# Patient Record
Sex: Male | Born: 1935 | Race: Black or African American | Hispanic: No | Smoking: Current some day smoker
Health system: Southern US, Community
[De-identification: ages and names within clinical notes are randomized; demographics above are authoritative.]

## PROBLEM LIST (undated history)

## (undated) DIAGNOSIS — I10 Essential (primary) hypertension: Secondary | ICD-10-CM

## (undated) DIAGNOSIS — I951 Orthostatic hypotension: Secondary | ICD-10-CM

## (undated) DIAGNOSIS — I739 Peripheral vascular disease, unspecified: Secondary | ICD-10-CM

## (undated) DIAGNOSIS — I472 Ventricular tachycardia: Secondary | ICD-10-CM

## (undated) DIAGNOSIS — E785 Hyperlipidemia, unspecified: Secondary | ICD-10-CM

## (undated) DIAGNOSIS — I252 Old myocardial infarction: Secondary | ICD-10-CM

## (undated) DIAGNOSIS — I4891 Unspecified atrial fibrillation: Secondary | ICD-10-CM

## (undated) DIAGNOSIS — I2510 Atherosclerotic heart disease of native coronary artery without angina pectoris: Secondary | ICD-10-CM

## (undated) DIAGNOSIS — I340 Nonrheumatic mitral (valve) insufficiency: Secondary | ICD-10-CM

## (undated) DIAGNOSIS — I4729 Other ventricular tachycardia: Secondary | ICD-10-CM

## (undated) DIAGNOSIS — R55 Syncope and collapse: Secondary | ICD-10-CM

## (undated) DIAGNOSIS — D649 Anemia, unspecified: Secondary | ICD-10-CM

## (undated) DIAGNOSIS — J449 Chronic obstructive pulmonary disease, unspecified: Secondary | ICD-10-CM

## (undated) DIAGNOSIS — I480 Paroxysmal atrial fibrillation: Secondary | ICD-10-CM

## (undated) DIAGNOSIS — E43 Unspecified severe protein-calorie malnutrition: Secondary | ICD-10-CM

## (undated) HISTORY — DX: Nonrheumatic mitral (valve) insufficiency: I34.0

## (undated) HISTORY — PX: CORONARY ANGIOPLASTY WITH STENT PLACEMENT: SHX49

---

## 2001-03-07 ENCOUNTER — Ambulatory Visit (HOSPITAL_COMMUNITY): Admission: RE | Admit: 2001-03-07 | Discharge: 2001-03-07 | Payer: Self-pay | Admitting: Nephrology

## 2001-09-15 ENCOUNTER — Emergency Department (HOSPITAL_COMMUNITY): Admission: EM | Admit: 2001-09-15 | Discharge: 2001-09-15 | Payer: Self-pay | Admitting: Emergency Medicine

## 2001-09-15 ENCOUNTER — Encounter: Payer: Self-pay | Admitting: Emergency Medicine

## 2002-02-21 ENCOUNTER — Ambulatory Visit (HOSPITAL_COMMUNITY): Admission: RE | Admit: 2002-02-21 | Discharge: 2002-02-22 | Payer: Self-pay | Admitting: Cardiology

## 2002-02-21 ENCOUNTER — Encounter: Payer: Self-pay | Admitting: Cardiology

## 2002-04-03 ENCOUNTER — Encounter (HOSPITAL_COMMUNITY): Admission: RE | Admit: 2002-04-03 | Discharge: 2002-05-03 | Payer: Self-pay | Admitting: *Deleted

## 2002-05-07 ENCOUNTER — Encounter (HOSPITAL_COMMUNITY): Admission: RE | Admit: 2002-05-07 | Discharge: 2002-06-06 | Payer: Self-pay | Admitting: *Deleted

## 2002-06-11 ENCOUNTER — Encounter (HOSPITAL_COMMUNITY): Admission: RE | Admit: 2002-06-11 | Discharge: 2002-07-11 | Payer: Self-pay | Admitting: *Deleted

## 2002-07-09 ENCOUNTER — Encounter (HOSPITAL_COMMUNITY): Admission: RE | Admit: 2002-07-09 | Discharge: 2002-08-08 | Payer: Self-pay | Admitting: *Deleted

## 2005-02-05 ENCOUNTER — Ambulatory Visit: Payer: Self-pay | Admitting: Family Medicine

## 2005-03-08 ENCOUNTER — Ambulatory Visit: Payer: Self-pay | Admitting: Family Medicine

## 2005-03-23 ENCOUNTER — Encounter (INDEPENDENT_AMBULATORY_CARE_PROVIDER_SITE_OTHER): Payer: Self-pay | Admitting: Family Medicine

## 2005-03-23 LAB — CONVERTED CEMR LAB: PSA: 1.59 ng/mL

## 2005-05-10 ENCOUNTER — Ambulatory Visit: Payer: Self-pay | Admitting: Family Medicine

## 2005-09-22 ENCOUNTER — Ambulatory Visit: Payer: Self-pay | Admitting: Family Medicine

## 2005-10-27 ENCOUNTER — Ambulatory Visit (HOSPITAL_COMMUNITY): Admission: RE | Admit: 2005-10-27 | Discharge: 2005-10-27 | Payer: Self-pay | Admitting: Family Medicine

## 2005-11-01 ENCOUNTER — Ambulatory Visit: Payer: Self-pay | Admitting: Family Medicine

## 2005-11-04 ENCOUNTER — Ambulatory Visit: Payer: Self-pay | Admitting: Internal Medicine

## 2005-11-12 ENCOUNTER — Ambulatory Visit (HOSPITAL_COMMUNITY): Admission: RE | Admit: 2005-11-12 | Discharge: 2005-11-12 | Payer: Self-pay | Admitting: *Deleted

## 2005-11-23 ENCOUNTER — Ambulatory Visit: Payer: Self-pay | Admitting: Internal Medicine

## 2005-12-09 ENCOUNTER — Ambulatory Visit: Payer: Self-pay | Admitting: Family Medicine

## 2005-12-17 ENCOUNTER — Encounter (INDEPENDENT_AMBULATORY_CARE_PROVIDER_SITE_OTHER): Payer: Self-pay | Admitting: Family Medicine

## 2005-12-27 ENCOUNTER — Encounter (INDEPENDENT_AMBULATORY_CARE_PROVIDER_SITE_OTHER): Payer: Self-pay | Admitting: Family Medicine

## 2005-12-27 LAB — CONVERTED CEMR LAB: aPTT: 30 s

## 2005-12-30 ENCOUNTER — Ambulatory Visit: Payer: Self-pay | Admitting: Family Medicine

## 2006-01-27 ENCOUNTER — Ambulatory Visit: Payer: Self-pay | Admitting: Family Medicine

## 2006-03-07 ENCOUNTER — Ambulatory Visit: Payer: Self-pay | Admitting: Family Medicine

## 2006-03-10 ENCOUNTER — Ambulatory Visit: Payer: Self-pay | Admitting: Internal Medicine

## 2006-03-28 ENCOUNTER — Ambulatory Visit: Payer: Self-pay | Admitting: Family Medicine

## 2006-03-28 LAB — CONVERTED CEMR LAB
PSA: 1.25 ng/mL
PSA: 1.25 ng/mL

## 2006-04-11 ENCOUNTER — Ambulatory Visit: Payer: Self-pay | Admitting: Family Medicine

## 2006-05-09 ENCOUNTER — Ambulatory Visit: Payer: Self-pay | Admitting: Family Medicine

## 2006-06-09 ENCOUNTER — Ambulatory Visit: Payer: Self-pay | Admitting: Family Medicine

## 2006-07-07 ENCOUNTER — Ambulatory Visit: Payer: Self-pay | Admitting: Family Medicine

## 2006-08-17 ENCOUNTER — Ambulatory Visit (HOSPITAL_COMMUNITY): Admission: RE | Admit: 2006-08-17 | Discharge: 2006-08-17 | Payer: Self-pay | Admitting: Urology

## 2007-01-03 ENCOUNTER — Encounter: Payer: Self-pay | Admitting: Family Medicine

## 2007-01-03 DIAGNOSIS — F528 Other sexual dysfunction not due to a substance or known physiological condition: Secondary | ICD-10-CM | POA: Insufficient documentation

## 2007-01-03 DIAGNOSIS — N318 Other neuromuscular dysfunction of bladder: Secondary | ICD-10-CM

## 2007-01-03 DIAGNOSIS — E785 Hyperlipidemia, unspecified: Secondary | ICD-10-CM

## 2007-01-03 DIAGNOSIS — I6529 Occlusion and stenosis of unspecified carotid artery: Secondary | ICD-10-CM

## 2007-01-03 DIAGNOSIS — I519 Heart disease, unspecified: Secondary | ICD-10-CM

## 2007-01-03 DIAGNOSIS — H269 Unspecified cataract: Secondary | ICD-10-CM | POA: Insufficient documentation

## 2007-01-03 DIAGNOSIS — N200 Calculus of kidney: Secondary | ICD-10-CM | POA: Insufficient documentation

## 2007-01-03 DIAGNOSIS — O88219 Thromboembolism in pregnancy, unspecified trimester: Secondary | ICD-10-CM | POA: Insufficient documentation

## 2007-01-03 DIAGNOSIS — G252 Other specified forms of tremor: Secondary | ICD-10-CM

## 2007-01-03 DIAGNOSIS — I10 Essential (primary) hypertension: Secondary | ICD-10-CM | POA: Insufficient documentation

## 2007-01-03 DIAGNOSIS — G250 Essential tremor: Secondary | ICD-10-CM | POA: Insufficient documentation

## 2007-01-03 DIAGNOSIS — Z87898 Personal history of other specified conditions: Secondary | ICD-10-CM | POA: Insufficient documentation

## 2007-01-05 ENCOUNTER — Ambulatory Visit: Payer: Self-pay | Admitting: Family Medicine

## 2007-01-05 DIAGNOSIS — D649 Anemia, unspecified: Secondary | ICD-10-CM

## 2007-01-09 ENCOUNTER — Ambulatory Visit (HOSPITAL_COMMUNITY): Admission: RE | Admit: 2007-01-09 | Discharge: 2007-01-09 | Payer: Self-pay | Admitting: Urology

## 2007-01-09 ENCOUNTER — Encounter (INDEPENDENT_AMBULATORY_CARE_PROVIDER_SITE_OTHER): Payer: Self-pay | Admitting: Family Medicine

## 2007-02-02 ENCOUNTER — Encounter (INDEPENDENT_AMBULATORY_CARE_PROVIDER_SITE_OTHER): Payer: Self-pay | Admitting: Family Medicine

## 2007-02-03 ENCOUNTER — Ambulatory Visit: Payer: Self-pay | Admitting: Family Medicine

## 2007-04-21 ENCOUNTER — Encounter (INDEPENDENT_AMBULATORY_CARE_PROVIDER_SITE_OTHER): Payer: Self-pay | Admitting: Family Medicine

## 2007-05-17 ENCOUNTER — Encounter (INDEPENDENT_AMBULATORY_CARE_PROVIDER_SITE_OTHER): Payer: Self-pay | Admitting: Family Medicine

## 2007-05-22 ENCOUNTER — Ambulatory Visit: Payer: Self-pay | Admitting: Family Medicine

## 2007-05-22 DIAGNOSIS — I739 Peripheral vascular disease, unspecified: Secondary | ICD-10-CM | POA: Insufficient documentation

## 2007-05-22 LAB — CONVERTED CEMR LAB: LDL Goal: 70 mg/dL

## 2007-06-14 ENCOUNTER — Ambulatory Visit (HOSPITAL_COMMUNITY): Admission: RE | Admit: 2007-06-14 | Discharge: 2007-06-14 | Payer: Self-pay | Admitting: Urology

## 2007-06-20 ENCOUNTER — Inpatient Hospital Stay (HOSPITAL_COMMUNITY): Admission: RE | Admit: 2007-06-20 | Discharge: 2007-06-22 | Payer: Self-pay | Admitting: Urology

## 2007-06-20 ENCOUNTER — Encounter (INDEPENDENT_AMBULATORY_CARE_PROVIDER_SITE_OTHER): Payer: Self-pay | Admitting: Urology

## 2007-06-24 ENCOUNTER — Emergency Department (HOSPITAL_COMMUNITY): Admission: EM | Admit: 2007-06-24 | Discharge: 2007-06-24 | Payer: Self-pay | Admitting: Emergency Medicine

## 2007-06-25 ENCOUNTER — Observation Stay (HOSPITAL_COMMUNITY): Admission: EM | Admit: 2007-06-25 | Discharge: 2007-06-29 | Payer: Self-pay | Admitting: Emergency Medicine

## 2007-08-31 ENCOUNTER — Encounter: Payer: Self-pay | Admitting: Family Medicine

## 2007-09-15 ENCOUNTER — Ambulatory Visit: Payer: Self-pay | Admitting: Family Medicine

## 2007-09-15 DIAGNOSIS — N320 Bladder-neck obstruction: Secondary | ICD-10-CM | POA: Insufficient documentation

## 2007-09-15 LAB — CONVERTED CEMR LAB: Hemoglobin: 12.4 g/dL

## 2007-09-27 ENCOUNTER — Encounter (INDEPENDENT_AMBULATORY_CARE_PROVIDER_SITE_OTHER): Payer: Self-pay | Admitting: Family Medicine

## 2007-09-28 ENCOUNTER — Telehealth (INDEPENDENT_AMBULATORY_CARE_PROVIDER_SITE_OTHER): Payer: Self-pay | Admitting: *Deleted

## 2007-09-28 LAB — CONVERTED CEMR LAB
ALT: <8 units/L (ref 0–53)
Basophils Absolute: 0 10*3/uL (ref 0.0–0.1)
CO2: 25 meq/L (ref 19–32)
Calcium: 9.6 mg/dL (ref 8.4–10.5)
Chloride: 105 meq/L (ref 96–112)
Cholesterol: 212 mg/dL — ABNORMAL HIGH (ref 0–200)
Eosinophils Relative: 4 % (ref 0–5)
Glucose, Bld: 96 mg/dL (ref 70–99)
HCT: 39.7 % (ref 39.0–52.0)
Hemoglobin: 12.9 g/dL — ABNORMAL LOW (ref 13.0–17.0)
Lymphocytes Relative: 43 % (ref 12–46)
Lymphs Abs: 2.7 10*3/uL (ref 0.7–4.0)
Monocytes Absolute: 0.4 10*3/uL (ref 0.1–1.0)
Neutro Abs: 2.9 10*3/uL (ref 1.7–7.7)
Platelets: 180 10*3/uL (ref 150–400)
Sodium: 141 meq/L (ref 135–145)
Total Bilirubin: 0.3 mg/dL (ref 0.3–1.2)
Total Protein: 7.2 g/dL (ref 6.0–8.3)
Triglycerides: 120 mg/dL (ref <150)
VLDL: 24 mg/dL (ref 0–40)
WBC: 6.3 10*3/uL (ref 4.0–10.5)

## 2007-09-29 ENCOUNTER — Ambulatory Visit: Payer: Self-pay | Admitting: Family Medicine

## 2007-10-19 ENCOUNTER — Ambulatory Visit: Payer: Self-pay | Admitting: Family Medicine

## 2007-12-15 ENCOUNTER — Telehealth (INDEPENDENT_AMBULATORY_CARE_PROVIDER_SITE_OTHER): Payer: Self-pay | Admitting: *Deleted

## 2007-12-15 ENCOUNTER — Ambulatory Visit: Payer: Self-pay | Admitting: Family Medicine

## 2007-12-15 DIAGNOSIS — F17200 Nicotine dependence, unspecified, uncomplicated: Secondary | ICD-10-CM | POA: Insufficient documentation

## 2007-12-15 LAB — CONVERTED CEMR LAB
Bilirubin Urine: NEGATIVE
Glucose, Urine, Semiquant: NEGATIVE
Ketones, urine, test strip: NEGATIVE
Specific Gravity, Urine: 1.015

## 2007-12-18 ENCOUNTER — Encounter (INDEPENDENT_AMBULATORY_CARE_PROVIDER_SITE_OTHER): Payer: Self-pay | Admitting: Family Medicine

## 2007-12-28 ENCOUNTER — Encounter (INDEPENDENT_AMBULATORY_CARE_PROVIDER_SITE_OTHER): Payer: Self-pay | Admitting: Family Medicine

## 2007-12-28 ENCOUNTER — Ambulatory Visit (HOSPITAL_COMMUNITY): Admission: RE | Admit: 2007-12-28 | Discharge: 2007-12-28 | Payer: Self-pay | Admitting: Family Medicine

## 2008-01-08 ENCOUNTER — Telehealth (INDEPENDENT_AMBULATORY_CARE_PROVIDER_SITE_OTHER): Payer: Self-pay | Admitting: *Deleted

## 2008-01-12 ENCOUNTER — Telehealth (INDEPENDENT_AMBULATORY_CARE_PROVIDER_SITE_OTHER): Payer: Self-pay | Admitting: *Deleted

## 2008-01-12 ENCOUNTER — Ambulatory Visit: Payer: Self-pay | Admitting: Family Medicine

## 2008-01-13 ENCOUNTER — Encounter (INDEPENDENT_AMBULATORY_CARE_PROVIDER_SITE_OTHER): Payer: Self-pay | Admitting: Family Medicine

## 2008-01-14 LAB — CONVERTED CEMR LAB
Cholesterol: 157 mg/dL (ref 0–200)
Eosinophils Absolute: 0.3 10*3/uL (ref 0.0–0.7)
Eosinophils Relative: 4 % (ref 0–5)
HCT: 38.3 % — ABNORMAL LOW (ref 39.0–52.0)
Hemoglobin: 12.1 g/dL — ABNORMAL LOW (ref 13.0–17.0)
LDL Cholesterol: 92 mg/dL (ref 0–99)
Lymphocytes Relative: 40 % (ref 12–46)
Lymphs Abs: 2.6 10*3/uL (ref 0.7–4.0)
MCV: 92.3 fL (ref 78.0–100.0)
Monocytes Absolute: 0.4 10*3/uL (ref 0.1–1.0)
Monocytes Relative: 6 % (ref 3–12)
Platelets: 186 10*3/uL (ref 150–400)
RBC: 4.15 M/uL — ABNORMAL LOW (ref 4.22–5.81)
Total CHOL/HDL Ratio: 3.3
VLDL: 18 mg/dL (ref 0–40)
WBC: 6.6 10*3/uL (ref 4.0–10.5)

## 2008-01-15 ENCOUNTER — Telehealth (INDEPENDENT_AMBULATORY_CARE_PROVIDER_SITE_OTHER): Payer: Self-pay | Admitting: *Deleted

## 2008-01-30 ENCOUNTER — Encounter (INDEPENDENT_AMBULATORY_CARE_PROVIDER_SITE_OTHER): Payer: Self-pay | Admitting: Family Medicine

## 2008-02-09 ENCOUNTER — Ambulatory Visit: Payer: Self-pay | Admitting: Family Medicine

## 2008-02-15 ENCOUNTER — Encounter (INDEPENDENT_AMBULATORY_CARE_PROVIDER_SITE_OTHER): Payer: Self-pay | Admitting: Family Medicine

## 2008-02-27 ENCOUNTER — Ambulatory Visit: Payer: Self-pay | Admitting: Family Medicine

## 2008-03-22 ENCOUNTER — Ambulatory Visit: Payer: Self-pay | Admitting: Family Medicine

## 2008-03-22 LAB — CONVERTED CEMR LAB: Hemoglobin: 11.8 g/dL

## 2008-03-28 ENCOUNTER — Encounter (INDEPENDENT_AMBULATORY_CARE_PROVIDER_SITE_OTHER): Payer: Self-pay | Admitting: Family Medicine

## 2008-04-19 ENCOUNTER — Ambulatory Visit: Payer: Self-pay | Admitting: Family Medicine

## 2008-04-20 ENCOUNTER — Encounter (INDEPENDENT_AMBULATORY_CARE_PROVIDER_SITE_OTHER): Payer: Self-pay | Admitting: Family Medicine

## 2008-04-22 LAB — CONVERTED CEMR LAB
ALT: <8 units/L (ref 0–53)
Alkaline Phosphatase: 56 units/L (ref 39–117)
CO2: 22 meq/L (ref 19–32)
Creatinine, Ser: 1.1 mg/dL (ref 0.40–1.50)
Total Bilirubin: 0.4 mg/dL (ref 0.3–1.2)

## 2008-05-13 ENCOUNTER — Encounter (INDEPENDENT_AMBULATORY_CARE_PROVIDER_SITE_OTHER): Payer: Self-pay | Admitting: Family Medicine

## 2008-06-19 ENCOUNTER — Ambulatory Visit: Payer: Self-pay | Admitting: Family Medicine

## 2008-06-20 ENCOUNTER — Encounter (INDEPENDENT_AMBULATORY_CARE_PROVIDER_SITE_OTHER): Payer: Self-pay | Admitting: Family Medicine

## 2008-07-04 ENCOUNTER — Ambulatory Visit: Payer: Self-pay | Admitting: Family Medicine

## 2008-07-04 DIAGNOSIS — K5900 Constipation, unspecified: Secondary | ICD-10-CM | POA: Insufficient documentation

## 2008-07-06 ENCOUNTER — Encounter (INDEPENDENT_AMBULATORY_CARE_PROVIDER_SITE_OTHER): Payer: Self-pay | Admitting: Family Medicine

## 2008-07-08 LAB — CONVERTED CEMR LAB
Albumin: 4 g/dL (ref 3.5–5.2)
BUN: 17 mg/dL (ref 6–23)
CO2: 24 meq/L (ref 19–32)
Glucose, Bld: 74 mg/dL (ref 70–99)
Potassium: 4 meq/L (ref 3.5–5.3)
Sodium: 140 meq/L (ref 135–145)
Total Protein: 7.2 g/dL (ref 6.0–8.3)

## 2008-08-20 ENCOUNTER — Emergency Department (HOSPITAL_COMMUNITY): Admission: EM | Admit: 2008-08-20 | Discharge: 2008-08-20 | Payer: Self-pay | Admitting: Emergency Medicine

## 2008-09-15 ENCOUNTER — Ambulatory Visit: Payer: Self-pay | Admitting: Cardiology

## 2008-09-16 ENCOUNTER — Inpatient Hospital Stay (HOSPITAL_COMMUNITY): Admission: AD | Admit: 2008-09-16 | Discharge: 2008-09-19 | Payer: Self-pay | Admitting: Cardiovascular Disease

## 2008-09-16 ENCOUNTER — Encounter (INDEPENDENT_AMBULATORY_CARE_PROVIDER_SITE_OTHER): Payer: Self-pay | Admitting: Family Medicine

## 2008-09-18 ENCOUNTER — Encounter (INDEPENDENT_AMBULATORY_CARE_PROVIDER_SITE_OTHER): Payer: Self-pay | Admitting: Family Medicine

## 2008-09-25 ENCOUNTER — Encounter (INDEPENDENT_AMBULATORY_CARE_PROVIDER_SITE_OTHER): Payer: Self-pay | Admitting: Family Medicine

## 2008-10-01 ENCOUNTER — Encounter (INDEPENDENT_AMBULATORY_CARE_PROVIDER_SITE_OTHER): Payer: Self-pay | Admitting: Family Medicine

## 2008-10-03 ENCOUNTER — Ambulatory Visit: Payer: Self-pay | Admitting: Family Medicine

## 2008-10-03 DIAGNOSIS — I4891 Unspecified atrial fibrillation: Secondary | ICD-10-CM | POA: Insufficient documentation

## 2008-10-07 ENCOUNTER — Encounter (INDEPENDENT_AMBULATORY_CARE_PROVIDER_SITE_OTHER): Payer: Self-pay | Admitting: Family Medicine

## 2008-10-31 ENCOUNTER — Ambulatory Visit: Payer: Self-pay | Admitting: Family Medicine

## 2008-11-12 ENCOUNTER — Encounter (INDEPENDENT_AMBULATORY_CARE_PROVIDER_SITE_OTHER): Payer: Self-pay | Admitting: Family Medicine

## 2008-11-14 LAB — CONVERTED CEMR LAB
ALT: <8 units/L (ref 0–53)
Albumin: 4 g/dL (ref 3.5–5.2)
CO2: 24 meq/L (ref 19–32)
Chloride: 104 meq/L (ref 96–112)
Lymphocytes Relative: 38 % (ref 12–46)
Lymphs Abs: 2.2 10*3/uL (ref 0.7–4.0)
Monocytes Relative: 9 % (ref 3–12)
Neutro Abs: 2.9 10*3/uL (ref 1.7–7.7)
Neutrophils Relative %: 49 % (ref 43–77)
Potassium: 3.9 meq/L (ref 3.5–5.3)
RBC: 4.03 M/uL — ABNORMAL LOW (ref 4.22–5.81)
Sodium: 141 meq/L (ref 135–145)
Total Bilirubin: 0.4 mg/dL (ref 0.3–1.2)
Total Protein: 7.3 g/dL (ref 6.0–8.3)
WBC: 5.9 10*3/uL (ref 4.0–10.5)

## 2009-01-03 ENCOUNTER — Ambulatory Visit: Payer: Self-pay | Admitting: Family Medicine

## 2009-01-03 DIAGNOSIS — M79609 Pain in unspecified limb: Secondary | ICD-10-CM

## 2009-01-09 ENCOUNTER — Encounter (INDEPENDENT_AMBULATORY_CARE_PROVIDER_SITE_OTHER): Payer: Self-pay | Admitting: Family Medicine

## 2009-01-11 ENCOUNTER — Encounter (INDEPENDENT_AMBULATORY_CARE_PROVIDER_SITE_OTHER): Payer: Self-pay | Admitting: Family Medicine

## 2009-01-17 ENCOUNTER — Encounter (INDEPENDENT_AMBULATORY_CARE_PROVIDER_SITE_OTHER): Payer: Self-pay | Admitting: *Deleted

## 2009-01-17 LAB — CONVERTED CEMR LAB
Albumin: 4.1 g/dL (ref 3.5–5.2)
Alkaline Phosphatase: 66 units/L (ref 39–117)
BUN: 19 mg/dL (ref 6–23)
Basophils Absolute: 0 10*3/uL (ref 0.0–0.1)
Basophils Relative: 1 % (ref 0–1)
CO2: 27 meq/L (ref 19–32)
Cholesterol: 140 mg/dL (ref 0–200)
Eosinophils Relative: 3 % (ref 0–5)
Glucose, Bld: 91 mg/dL (ref 70–99)
HCT: 37.5 % — ABNORMAL LOW (ref 39.0–52.0)
HDL: 44 mg/dL (ref >39)
Hemoglobin: 12.6 g/dL — ABNORMAL LOW (ref 13.0–17.0)
Lymphocytes Relative: 43 % (ref 12–46)
MCHC: 33.6 g/dL (ref 30.0–36.0)
Monocytes Absolute: 0.5 10*3/uL (ref 0.1–1.0)
Monocytes Relative: 6 % (ref 3–12)
Neutro Abs: 3.6 10*3/uL (ref 1.7–7.7)
Potassium: 4.1 meq/L (ref 3.5–5.3)
RBC: 4.11 M/uL — ABNORMAL LOW (ref 4.22–5.81)
RDW: 13.6 % (ref 11.5–15.5)
Sodium: 141 meq/L (ref 135–145)
Total Protein: 7.5 g/dL (ref 6.0–8.3)
Triglycerides: 87 mg/dL (ref <150)

## 2009-02-05 ENCOUNTER — Encounter (INDEPENDENT_AMBULATORY_CARE_PROVIDER_SITE_OTHER): Payer: Self-pay | Admitting: Family Medicine

## 2009-04-10 ENCOUNTER — Ambulatory Visit: Payer: Self-pay | Admitting: Family Medicine

## 2009-04-10 DIAGNOSIS — R634 Abnormal weight loss: Secondary | ICD-10-CM | POA: Insufficient documentation

## 2010-09-08 IMAGING — CR DG CHEST 1V
1 series · 1 of 1 positions shown · non-contrast
Comparison: 11/12/2005

CLINICAL DATA: Abdominal pain.  Groin pain.

CHEST - 1 VIEW

[view not recorded]
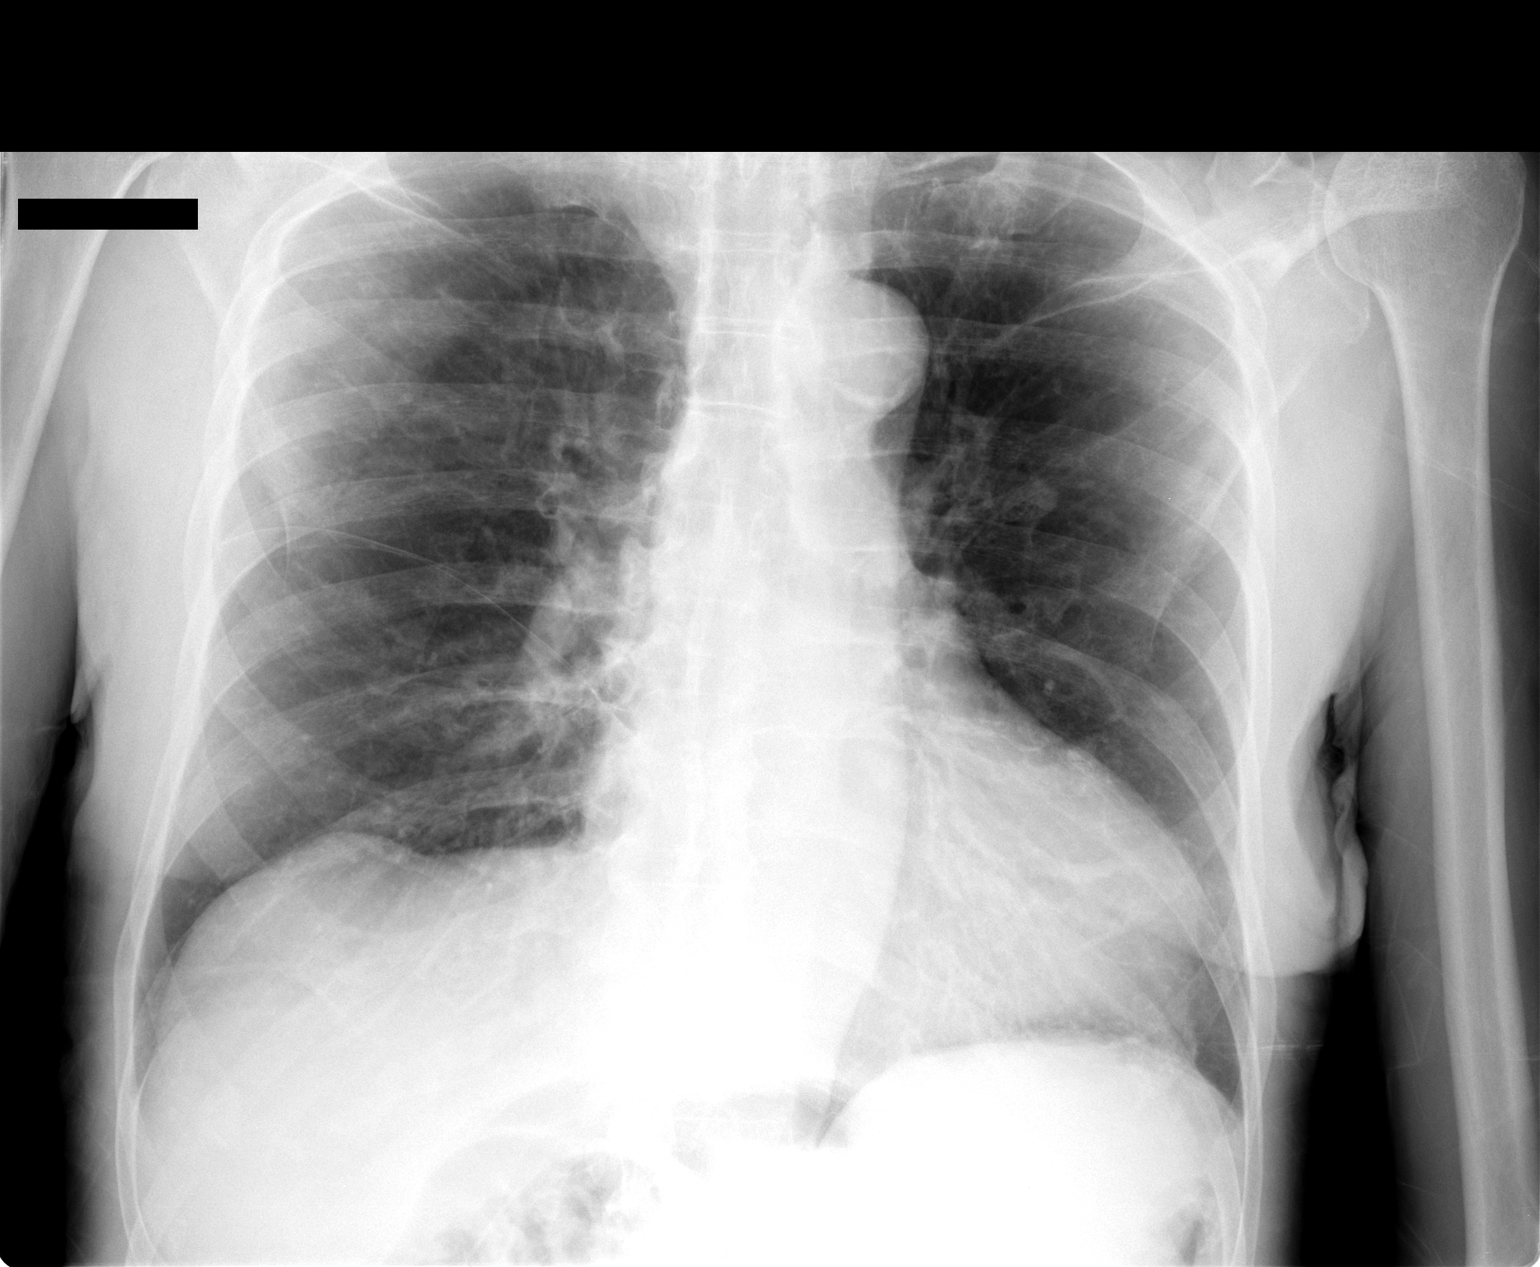

[1 of 1 positions shown; findings below may reference images not displayed]

FINDINGS: Lung volumes are lower than on prior exam with elevation
of the right hemidiaphragm.  No airspace disease is present.  No
free air underneath the hemidiaphragms.  Heart size is mildly
enlarged.  Thoracic aortic atherosclerosis.

Question pulmonary nodule adjacent to the left hilar structures on
the frontal view versus prominent pulmonary vasculature.  Follow-up
PA and lateral chest radiograph recommended with full inspiration.
IMPRESSION: 1.  No acute cardiopulmonary disease.  Elevation of the right
hemidiaphragm with associated atelectasis.
2.  Question pulmonary nodule left hilar region.  Follow-up PA and
lateral recommended.

## 2010-09-20 ENCOUNTER — Encounter: Payer: Self-pay | Admitting: Family Medicine

## 2010-09-29 NOTE — Letter (Signed)
Summary: rpc chart  rpc chart   Imported By: Curtis Sites 06/03/2010 17:01:14  _____________________________________________________________________  External Attachment:    Type:   Image     Comment:   External Document

## 2010-12-14 LAB — BASIC METABOLIC PANEL
BUN: 15 mg/dL (ref 6–23)
CO2: 27 mEq/L (ref 19–32)
Chloride: 105 mEq/L (ref 96–112)
GFR calc non Af Amer: >60 mL/min (ref >60)
Glucose, Bld: 85 mg/dL (ref 70–99)
Potassium: 3.9 mEq/L (ref 3.5–5.1)
Sodium: 137 mEq/L (ref 135–145)

## 2010-12-14 LAB — LIPID PANEL
Cholesterol: 194 mg/dL (ref 0–200)
LDL Cholesterol: 144 mg/dL — ABNORMAL HIGH (ref 0–99)
Total CHOL/HDL Ratio: 5.4 RATIO

## 2010-12-14 LAB — TROPONIN I: Troponin I: 0.39 ng/mL — ABNORMAL HIGH (ref 0.00–0.06)

## 2010-12-14 LAB — CBC
HCT: 34.8 % — ABNORMAL LOW (ref 39.0–52.0)
Hemoglobin: 11.4 g/dL — ABNORMAL LOW (ref 13.0–17.0)
MCV: 94.1 fL (ref 78.0–100.0)
RDW: 13.5 % (ref 11.5–15.5)

## 2010-12-14 LAB — CK TOTAL AND CKMB (NOT AT ARMC)
CK, MB: 1.9 ng/mL (ref 0.3–4.0)
CK, MB: 3 ng/mL (ref 0.3–4.0)
Relative Index: INVALID (ref 0.0–2.5)
Total CK: 82 U/L (ref 7–232)

## 2010-12-14 LAB — TSH: TSH: 1.57 u[IU]/mL (ref 0.350–4.500)

## 2010-12-14 LAB — APTT: aPTT: 41 seconds — ABNORMAL HIGH (ref 24–37)

## 2011-01-12 NOTE — H&P (Signed)
NAME:  Jeffrey [name], MOGLE NO.:  [id number]   MEDICAL RECORD NO.:  [id number]          PATIENT TYPE:  AMB   LOCATION:  DAY                           FACILITY:  [facility]   PHYSICIAN:  Dennie Maizes, M.D.   DATE OF BIRTH:  Jul 04, 1918   DATE OF ADMISSION:  DATE OF DISCHARGE:  LH                              HISTORY & PHYSICAL   CHIEF COMPLAINT:  Urinary retention, enlarged prostate with bladder neck  obstruction,  recurrent urinary tract infection.   HISTORY OF PRESENT ILLNESS:  This 77 year old male has been under my  care for several years.  He had undergone transurethral resection of  prostate and removal of bladder calculi in 1990.  Incidental carcinoma  of the prostate was noted at that time.  The patient was seen in the  office about 6 months ago with recurrent urinary tract infection.  Cystoscopy revealed BPH with bladder neck obstruction.  It was treated  with medications like Flomax.  He has developed urinary retention.  Trial of voiding on multiple occasions failed.  The patient is unable to  empty the bladder.  He is brought to the [facility] today for  transurethral resection of the prostate.   PAST MEDICAL HISTORY:  1. Status post TURP and removal of bladder calculi in 1990.  2. History of elevated cholesterol, diastolic dysfunction, coronary      artery disease.   MEDICATIONS:  1. NitroQuick 0.4 mg sublingual p.r.n. chest pain.  2. Metoprolol 25 mg 1 p.o. daily.  3. Simvastatin 40 mg 1 p.o. daily.   ALLERGIES:  None.   PHYSICAL EXAMINATION:  HEAD, EYES, EARS, NOSE, THROAT:  Normal.  NECK:  No masses.  LUNGS:  Clear to auscultation.  HEART:  Regular rate and rhythm, no murmurs.  ABDOMEN:  Soft, no palpable flank, mass, or CVA tenderness.  The bladder  is not palpable.  GENITALIA:  The penis and testes are normal.  RECTAL EXAM:  40 mg, smooth, benign prostate.   IMPRESSION:  Urinary retention, BPH with bladder neck obstruction.   PLAN:  1.  Transurethral resection of prostate in the [facility].  The      patient will be admitted to the hospital in the postoperative      period.  2. He has been taking aspirin.  It will be stopped for a week prior to      the surgery.  3. I explained to the patient regarding the diagnosis, operative      details, alternative treatment and outcome, possible risks and      complications, and he is agreeable for the procedure to be done.      Dennie Maizes, M.D.  Electronically Signed     SK/MEDQ  D:  06/13/2007  T:  06/13/2007  Job:  [id number]   cc:   [facility]

## 2011-01-12 NOTE — H&P (Signed)
NAME:  Jeffrey [name], Jeffrey [name] NO.:  [id number]   MEDICAL RECORD NO.:  [id number]          PATIENT TYPE:  INP   LOCATION:  A312                          FACILITY:  [facility]   PHYSICIAN:  Dennie Maizes, M.D.   DATE OF BIRTH:  [birth year]   DATE OF ADMISSION:  06/25/2007  DATE OF DISCHARGE:  LH                              HISTORY & PHYSICAL   CHIEF COMPLAINT:  Hematuria, post transurethral resection of bladder  neck stenosis.   HISTORY OF PRESENT ILLNESS:  This 77 year old male has been under my  care for several years. He has undergone transurethral resection of the  prostate with removal of bladder calculi in 1990. Incidental carcinoma  of the prostate was noted  at that time. His PSA has been stable. He has  been seen in the office with a history of recurrent urinary tract  infections. The patient developed urinary retention with a large amount  of  post void residual. He failed medical treatment, he has failed trial  of catheter removal x2. He was brought to the [facility] on  June 20, 2007 for transurethral resection  of the bladder neck and  prostate. The patient did well in the postoperative period. The  hematuria cleared. The patient was voiding well after the catheter  removed. He was discharged and sent home on June 22, 2007. The  patient started having blood in the urine and he was passing blood  clots. He came to the emergency room with weakness. He was seen by Dr.  Jerre Simon and admitted to the hospital for further care.   PAST MEDICAL HISTORY:  Status post TURP and removal of bladder calculi  in 1990, history of elevated cholesterol, diastolic dysfunction,  coronary artery disease.   MEDICATIONS:  1. NitroQuick 0.4 mg sublingual p.r.n. for chest pain.  2. Metoprolol 25 mg p.o. daily.  3. Simvastatin 40 mg one p.o. daily.  4. Cipro 500 mg 1 p.o. b.i.d.   ALLERGIES:  None.   PHYSICAL EXAMINATION:  HEENT:  Normal.  NECK:  No masses.  LUNGS:   Clear to auscultation.  HEART:  Regular rate and rhythm, no murmurs.  ABDOMEN:  Soft, no palpable flank mass or CVA tenderness. Bladder not  palpable.  Penis and testes are normal.   The patient was seen in the emergency room with significant hematuria.   ADMISSION LABS:  CBC:  WBC 7.3, hemoglobin 9.6, hematocrit 28.0. BUN 23,  creatinine 1.39.   IMPRESSION:  Hematuria, clot retention, post transurethral resection of  bladder neck stenosis, anemia due to blood loss.   PLAN:  1. Admit the patient to the hospital.  2. IV fluids.  3. Bladder irrigations p.r.n. He may need triple lumen Foley catheter      with continuous bladder irrigation, blood transfusion as needed.      Will check his PT, PTT and bleeding time. The patient may need      cystoscopy, evacuation of blood clot and fulguration of the      prostate if needed.      Dennie Maizes, M.D.  Electronically Signed  SK/MEDQ  D:  06/26/2007  T:  06/27/2007  Job:  [id number]   cc:   Franchot Heidelberg, M.D.

## 2011-01-12 NOTE — Discharge Summary (Signed)
NAME:  Jeffrey [name], Jeffrey [name] NO.:  [id number]   MEDICAL RECORD NO.:  [id number]          PATIENT TYPE:  INP   LOCATION:  2001                         FACILITY:  [facility]   PHYSICIAN:  Richard A. Alanda Amass, M.D.DATE OF BIRTH:  07/31/1918   DATE OF ADMISSION:  09/16/2008  DATE OF DISCHARGE:  09/19/2008                               DISCHARGE SUMMARY   DISCHARGE DIAGNOSES:  1. Non-ST elevation myocardial infarction.  2. Paroxysmal atrial fibrillation, admitted in atrial fibrillation      with rapid ventricular response, now maintaining sinus rhythm.  3. Coronary artery disease with cardiac cath revealing 70% left main      stenosis, total right coronary artery stenosis, admitted with right-      to-right and left-to-right collaterals, 60% left circumflex.  4. Left ventricular dysfunction, ejection fraction 45%.  5. 2+ mitral regurgitation.  6. Tremor, on primidone.  7. Noncompliance.  Per drug store, he had not had his beta-blocker      filled since May 2009.  8. Chronic obstructive pulmonary disease with continued tobacco abuse.  9. Hyperlipidemia.  10.Peripheral vascular disease with renal artery stenosis and history      of right superficial femoral artery disease.   DISCHARGE CONDITION:  Improved.   PROCEDURES:  On September 17, 2008, combined left heart cath by Dr. Julieanne Manson.   DISCHARGE MEDICATIONS:  1. Metoprolol tartrate 50 mg half a tablet twice a day.  2. Nitroglycerin 0.4 mg sublingual as needed for chest pain.  3. Pravastatin 40 mg at bedtime.  4. Pletal 50 mg half a tablet twice a day.  5. Primidone 50 mg once at bed time daily.  6. Iron 65 mg daily.  7. Proventil inhaler daily as needed.  8. Aspirin 81 mg daily.  9. Zetia 10 mg daily.  10.MiraLax 1 capful daily as needed.  11.Protonix 40 mg daily.  12.Plavix 75 mg daily.  13.If you do not stay on the Plavix, then increase aspirin to 325 mg      daily.   DISCHARGE INSTRUCTIONS:  1. Low-sodium  heart-healthy diet.  2. Wash cath site with soap and water.  Call if any bleeding,      swelling, or drainage.  3. Increase activity slowly.  May shower.  No lifting for 2 days.  No      driving for 2 days.  4. Follow up with Dr. Elsie Lincoln in [place] on October 01, 2008 at 3      p.m.   HISTORY OF PRESENT ILLNESS:  An 77 year old patient, previous patient of  Dr. Domingo Sep, now with Dr. Elsie Lincoln in [place], was originally admitted  to [facility] secondary to feeling weak and dizzy.  He did have  chest pain as well.  He was found to be in AFib with rapid ventricular  response.  He had Cardiology consult, but then was transported to [facility]  for further cardiac evaluation.  His enzymes have been mildly positive  in [place].   Here at [facility], he had known history of coronary artery disease with  his last previous cath being in May 2007 at  [facility] with 40-50%  left main history of LAD stent that was patent to the OM, RCA had a 80-  90% stenosis.   Other history includes hyperlipidemia, mitral valve regurgitation, and  moderate mitral valve prolapse, COPD, erectile dysfunction, and  peripheral vascular disease with renal disease and left SFA mild disease  as well as right SFA.  He was transferred to [facility] as stated.  When we  looked back to see what medications he was taking as an outpatient, we  called the pharmacy and he had not had his metoprolol filled since April  or May 2009.  The patient was admitted to [facility], and then underwent  cardiac catheterization with results as stated previously in problem  list.  The patient did well postprocedure and no further chest pain.  The plan was to discharge him on September 18, 2008, but he had not yet  ambulated, and he missed his right home, so we kept him one more night  ambulated and started his Plavix, and instructed him that we do not  think he was a Coumadin candidate, partly due to compliance for the  atrial fibrillation,  but with his coronary artery disease as well as his  PAF, he may do better with Plavix and aspirin and aspirin alone.  He was  given 14 days' supply of Plavix or prescription for NI card for 14 days  of Plavix without cost and then he would have to pay after that, so we  were not sure if he will be able to manage to stay on it.  Prior to  discharge, he ambulated.  His blood pressure prior to ambulation was  100/50, after ambulation 80/50, but he had no symptoms with this and  felt quite well.  No complaints.  He was then discharged to home.   LABORATORIES:  Peak troponin prior to admission was 0.72 with a CK of 86  and a MB of 4.3 at [facility].  Here at [facility], lab values on admission, BNP  was 352.  Cardiac enzymes at [facility], CK 82 and MB of 3, troponin I of  0.39, CK of 71, MB of 1.9, and troponin I of 0.30.   TSH was 1.570.   Lipid panel; total cholesterol 194, triglycerides 72, HDL 36, and LDL  144.   Chemistry panel; sodium 137, potassium 3.9, chloride 105, CO2 of 27,  glucose 85, BUN 15, and creatinine 0.89.   Chest x-ray on admission stable mild cardiomegaly, mild changes of COPD.   EKGs; sinus rhythm to sinus bradycardia rate 57.  He has a right bundle-  branch block.  No acute changes.  The patient underwent cardiac cath at  [facility] with results as previously stated, will be followed as an  outpatient.  If he  does not tolerate or has continued angina, he may need a surgical  consult, but doubt he is a surgical candidate.  He was not discharged  with an ACE inhibitor secondary to blood pressure issues.  Additionally,  he is on pravastatin, though again I am not sure he was taking at home  or either he was not taking his beta-blocker.       Darcella Gasman. [place], N.P.      Richard A. Alanda Amass, M.D.  Electronically Signed    LRI/MEDQ  D:  09/19/2008  T:  09/20/2008  Job:  [id number]   cc:   Madaline Savage, M.D.  Franchot Heidelberg, M.D.

## 2011-01-12 NOTE — Op Note (Signed)
NAME:  Jeffrey [name], Jeffrey [name] NO.:  [id number]   MEDICAL RECORD NO.:  [id number]          PATIENT TYPE:  INP   LOCATION:  A213                          FACILITY:  [facility]   PHYSICIAN:  Dennie Maizes, M.D.   DATE OF BIRTH:  10-06-1917   DATE OF PROCEDURE:  06/20/2007  DATE OF DISCHARGE:                               OPERATIVE REPORT   PREOPERATIVE DIAGNOSIS:  Urinary retention, bladder neck stenosis,  benign prostate hypertrophy, recurrent urinary tract infections.   POSTOPERATIVE DIAGNOSIS:  Urinary retention, bladder neck stenosis,  benign prostate hypertrophy, recurrent urinary tract infections.   OPERATIVE PROCEDURE:  Trnasurethral resection of bladder neck stenosis.   ANESTHESIA:  Spinal.   SURGEON:  Dennie Maizes, M.D.   COMPLICATIONS:  None.   ESTIMATED BLOOD LOSS:  Minimal.   DRAINS:  22 French triple lumen Foley catheter with 30 mL balloon in the  bladder.   INDICATIONS FOR PROCEDURE:  This 77 year old male has undergone  transurethral resection of the prostate in 1991.  He developed recurrent  urinary tract infections associated with urinary retention.  Evaluation  revealed BPH and bladder neck stenosis.  The patient is taken to the  operating room today for transurethral resection of the prostate/bladder  neck.   DESCRIPTION OF PROCEDURE:  Spinal anesthesia was induced and the patient  was placed on the OR table in the dorsal lithotomy position.  The  indwelling Foley catheter was removed.  The lower abdomen and genitalia  were prepped and draped in a sterile fashion.  Cystoscopy was done to 23  [place] scope.  There was evidence of previous TURP and bladder neck  stenosis was noted.  There was also some regrowth of the prostate near  the apex especially on the right side.  The bladder was found to be  normal.   The urethra was dilated up to 30 [place] with [employer] sounds.  A 28  French Iglesias resectoscope with continuous bladder irrigation was  then  inserted into the bladder.  The bladder neck was resected  circumferentially.  All the obstructing fibrous tissue was removed.  The  regrowth of the prostate on the right side near the apex was also  resected.  There was minimal bleeding.  The prostatic fossa was then  closely examined and complete hemostasis was obtained with  cauterization.  The bladder neck tissue was then removed from the  bladder and sent for  histopathological examination.  The instruments were removed.  A 22  French triple lumen Foley catheter with 30 mL balloon was inserted into  the bladder and continuous bladder irrigation was started.  The returns  were clear.  The patient was transferred to the PACU in a satisfactory  condition.      Dennie Maizes, M.D.  Electronically Signed     SK/MEDQ  D:  06/20/2007  T:  06/20/2007  Job:  [id number]   cc:   Franchot Heidelberg, M.D.

## 2011-01-12 NOTE — Procedures (Signed)
NAME:  Jeffrey [name], Jeffrey [name] NO.:  [id number]   MEDICAL RECORD NO.:  [id number]          PATIENT TYPE:  OUT   LOCATION:  RESP                          FACILITY:  [facility]   PHYSICIAN:  Edward L. Juanetta Gosling, M.D.DATE OF BIRTH:  1918/06/11   DATE OF PROCEDURE:  DATE OF DISCHARGE:  12/28/2007                            PULMONARY FUNCTION TEST   1. Spirometry shows a mild ventilatory defect with evidence of airflow      obstruction.  2. Lung volumes are normal.  3. DLCO is normal.  4. Arterial blood gas is normal.  5. There is improvement with inhaled bronchodilator, but it does not      reach the level of significance.      Edward L. Juanetta Gosling, M.D.  Electronically Signed     ELH/MEDQ  D:  01/01/2008  T:  01/01/2008  Job:  [id number]   cc:   Franchot Heidelberg, M.D.

## 2011-01-12 NOTE — Consult Note (Signed)
NAME:  Jeffrey [name], Jeffrey [name] NO.:  [id number]   MEDICAL RECORD NO.:  [id number]          PATIENT TYPE:  INP   LOCATION:  A213                          FACILITY:  [facility]   PHYSICIAN:  Dorris Singh, DO    DATE OF BIRTH:  Aug 17, 1918   DATE OF CONSULTATION:  06/20/2007  DATE OF DISCHARGE:                                 CONSULTATION   Patient is a pleasant 77 year old African American male who presented to  Dr. Chancy Milroy office with complaints of urinary retention.  He was seen  today in the office and progressed as a direct admit for urinary  obstruction/retention.  Patient states that the service of [facility] was  asked to follow Jeffrey [name] after his TURP and bladder neck.  He has no  current complaints.  States that he has an extensive heart history where  he has had a stent placed 20 years ago and currently is on medication  for that.  He is a patient of Dr. Claire Shown but denies any chest pain  at this point in time.   PAST MEDICAL HISTORY:  1. Status post TURP and removal of bladder calculi in 1990.  2. History of elevated cholesterol.  3. Diastolic dysfunction.  4. Coronary artery disease and stent placement 20 years ago.   CURRENT MEDICATIONS:  1. Aspirin 81 mg.  2. Lipitor 10 mg.  3. Zetia 10 mg.  4. Metoprolol 25 mg.  5. Simvastatin 40 mg.  6. Pletal 50 mg.  7. Tylenol No. 3.  8. Sublingual nitroglycerin 0.4 mg.  9. Viagra as needed.  10.Alka Seltzer as needed.   ALLERGIES:  NO KNOWN DRUG ALLERGIES.   SOCIAL HISTORY:  Significant for smoking half a pack a day over 50  years.  Denies any alcohol use or illicit drug use.  Currently has a  girlfriend who lives with him.   REVIEW OF SYSTEMS:  Currently are noncontributory other than some mild  suprapubic discomfort but patient seems to be doing well other than  that.   PHYSICAL EXAMINATION:  VITAL SIGNS:  Temperature 98.1, pulse 71,  respirations 18, blood pressure 112/69.  GENERAL APPEARANCE:   Patient is an 77 year old African American male who  is well-developed, well-nourished, in no acute distress.  HEENT:  Head is normocephalic and atraumatic.  Eyes are PERRLA, EOMI.  Mouth is no dentition on the upper jaw, however, patient has marginal  dentition on the lower jaw.  Ears are normal shape.  Gross auditory  acuity intact.  Mouth no erythema or exudate noted.  NECK:  No masses, full range of motion.  LUNGS:  Chest symmetrical with respirations.  No wheezing, rhonchi or  rales.  CARDIOVASCULAR:  Regular rate and rhythm, no murmurs, rubs, or gallops.  ABDOMEN:  Soft, nontender, nondistended, flat, no organomegaly noted.  No CVA tenderness.  GU:  Normal male external genitalia.  MUSCULOSKELETAL:  No muscular atrophy, weakness, full range of motion  upper and lower extremities.  No redness, swelling or tenderness noted.  NEUROLOGIC:  Cranial nerves II-XII grossly intact.   LABORATORY DATA:  Labs that were done on June 13, 2007, in his chart,  white count 8.5, hemoglobin 11.7, hematocrit 34.1,platelet count 198.  Sodium 138, potassium 4.3, chloride 103, CO2 32, glucose 111, BUN 18,  creatinine 1.09.  We will go ahead and obtain more recent lab work on  him as well.  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  Would recommend  clinical correlation from Dr. Domingo Sep, which was read two years ago.   ASSESSMENT/PLAN:  1. Extensive coronary artery disease with stent placement.  2. Hypertension.  3. Post transurethral resection of the prostate procedure.   PLAN:  Will continue to monitor patient's blood pressure which seems to  be controlled on current  regimen.  Will obtain CBC, CMP in the morning  as well as a magnesium and phosphate level.  Also will continue to  monitor all other vitals as well and change management as appropriate.   Thank you for this very kind consult.  We will continue to follow  patient as needed.      Dorris Singh, DO  Electronically Signed     CB/MEDQ  D:  06/20/2007  T:  06/20/2007  Job:  [id number]

## 2011-01-12 NOTE — Cardiovascular Report (Signed)
NAME:  Jeffrey [name], Jeffrey [name] NO.:  [id number]   MEDICAL RECORD NO.:  [id number]          PATIENT TYPE:  INP   LOCATION:  2001                         FACILITY:  [facility]   PHYSICIAN:  Thereasa Solo. Little, M.D. DATE OF BIRTH:  05/01/1918   DATE OF PROCEDURE:  09/17/2008  DATE OF DISCHARGE:                            CARDIAC CATHETERIZATION   INDICATIONS FOR TEST:  This 77 year old male had a stent placed to his  distal LAD in 2003.  He subsequently had a repeat cardiac  catheterization in 2007 that showed diffuse proximal and ostial PDA  disease.  He has been managed medically.  He was admitted to [facility] with what looks like rapid atrial fibrillation and low positive  cardiac markers.  His EKG showed changes inferiorly with T-wave  abnormalities.   Dr. Elsie Lincoln spoke with the family and the decision was made to proceed on  with repeat cardiac catheterization.   I spoke with both the patient and his son prior to the procedure.   After obtaining informed consent, the patient was prepped and draped in  the usual sterile fashion exposing the right groin.  Following local  anesthetic with 1% Xylocaine, the Seldinger technique was employed and a  5-French introducer sheath was placed in the right femoral artery.  Left  and right coronary arteriography and ventriculography in the RAO  projection was performed.   COMPLICATIONS:  None.   TOTAL CONTRAST USED:  70 mL.   EQUIPMENT:  5-French Judkins configuration catheters.   MEDICATIONS:  The patient has been premedicated with 5 mg of Valium.  I  gave him a milligram of Versed and 5.5 mg of fentanyl.   RESULTS:  1. Hemodynamic monitoring.  His central aortic pressure was 117/53.      His left ventricular pressure was 117/3 and there was no aortic      valve gradient noted at the time of pullback.  2. Ventriculography:  Ventriculography formed at the end of the      procedure using 20 mL of contrast at 12 mL per  second revealing the      posterior basilar and inferior wall to be hypokinetic.  The      ejection fraction was 40-45%.  There was +2 mitral regurgitation      appreciated.  3. Coronary arteriography:  Dense calcification in the left main and      proximal half the LAD was noted.      a.     Left main.  The left main was a large vessel at about 4 mm       but it had terminal 70% narrowing.  This extended into the ostium       of both the LAD and the circumflex.      b.     Circumflex.  There was ostial 60% narrowing in a large       circumflex system.  There were mild proximal irregularities in the       ongoing circumflex with remainder of the ongoing circumflex was       free of disease.  The first OM had ostial 70% narrowing, it was a       medium to large vessel.      c.     LAD.  The entire proximal half of the LAD was densely       calcified.  There was a mild 40-50% irregularities throughout.       This included the ostium.  There was stent in the midportion of       the LAD that was widely patent and the distal LAD did not show       significant disease.  There was a small diagonal that was free of       disease.      d.     Right coronary artery.  This vessel was 100% occluded in its       midportion.  There was late filling right to right collaterals of       the PDA and there were large collaterals from left to right.   CONCLUSION:  1. 45% ejection fraction.  2. +2 mitral regurgitation.  3. Terminal left main disease for about 70% with extension into the      ostium of the circumflex in particular.  4. Moderate stenosis in the ostium of OM #2.  5. Diffusely diseased with moderate stenosis in the LAD.  6. 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   When compared to the 2007 cardiac catheterization, the occlusion of the  right coronary artery is clearly new and there has been substantial  progression of the left main disease.   I  have the plan to discuss this with my colleagues, but I would try him  on medical therapy thinking that perhaps the event that raised his  cardiac markers was related to the occlusion of the RCA.  If he  continues to have angina; however, there is nothing that can be fixed  percutaneously because of the high-grade left main stenosis and CABG may  be the only alternative, but at 89 I am not sure he would ever fully  recover.           ______________________________  Thereasa Solo Little, M.D.     ABL/MEDQ  D:  09/17/2008  T:  09/18/2008  Job:  [id number]   cc:   Franchot Heidelberg, M.D.  Cristy Hilts. Jacinto Halim, MD  Cath Lab

## 2011-01-15 NOTE — Discharge Summary (Signed)
NAME:  Jeffrey [name], Jeffrey [name] NO.:  [id number]   MEDICAL RECORD NO.:  [id number]          PATIENT TYPE:  INP   LOCATION:  A213                          FACILITY:  [facility]   PHYSICIAN:  Dennie Maizes, M.D.   DATE OF BIRTH:  August 21, 1918   DATE OF ADMISSION:  06/20/2007  DATE OF DISCHARGE:  10/23/2008LH                               DISCHARGE SUMMARY   FINAL DIAGNOSIS:  1. Bladder neck stenosis.  2. Urinary retention.  3. Benign prostate hypertrophy.   OTHER DIAGNOSIS:  Coronary artery disease.   OPERATIVE PROCEDURE:  Transverse resection of bladder neck done on  June 20, 2007.  Complications:  None   DISCHARGE SUMMARY:  This 77 year old male has been under my care for  several years.  Underwent transverse resection of prostate for removal  of bladder cancer in 1990.  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  The patient was in the office about 41-months secondary to urinary tract  infection.  Cystoscopy revealed bladder neck obstruction.  Treated with  medication like Flomax.  He developed urinary retention.  Trial of  voiding on multiple occasions failed.  The patient was unable to empty  the bladder.  He was brought to the short-stay center on June 20, 2007, for transverse resection of prostate and bladder neck.   PAST MEDICAL HISTORY:  1. Status post TURP and removal of bladder cancer in 1990.  2. History of elevated cholesterol.  3. Diastolic dysfunction and coronary artery disease.   MEDICATIONS:  1. Nitroglycerin 0.4 mg sublingual p.r.n. chest pain.  2. Metoprolol 20 mg p.o. daily.  3. Simvastatin 40 mg p.o. daily   ALLERGIES:  None.   PHYSICAL EXAMINATION:  HEAD, EYES, EARS, NOSE, AND THROAT:  Normal.  NECK:  No masses.  LUNGS:  Clear to auscultation.  HEART:  Regular rate and rhythm.  No murmurs.  ABDOMEN:  Soft, no palpable flank mass or CVA tenderness.  Bladder is  not palpable.  GU:  Penis and testes are normal.  RECTAL:  A 40 gram, small,  benign prostate.   COURSE IN THE HOSPITAL:  Preop labs within normal range.  Taken to the  OR on June 20, 2007.  Under spinal anesthesia, transverse resection  of bladder neck was done.  The patient did well in the postoperative  period.  He was seen by the hospitalists for management of medical  problems.  Please refer to the hospitalist's dictated consultation  report.   The first postoperative day, the urine was clear.  The bladder  irrigation was discontinued.  The patient's labs were within normal  range with hemoglobin 10.7, hematocrit 31.2, WBC 7.9.  BUN 12,  creatinine 1.0.   Second postoperative day, the Foley catheter was removed, and the  patient able to empty the bladder well.  Discharged and sent home on  June 22, 2007.  He was given Cipro 5 mg p.o. b.i.d. for 7 days.  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  No evidence of malignancy was noted.  Condition of  the patient at time of  discharge is stable.  He was advised to call me  for fever, chills, gross hematuria.  He will be reviewed in the office  in 2 weeks.      Dennie Maizes, M.D.  Electronically Signed     SK/MEDQ  D:  08/18/2007  T:  08/19/2007  Job:  [id number]   cc:   Franchot Heidelberg, M.D.

## 2011-01-15 NOTE — Cardiovascular Report (Signed)
[place]  Patient:    ALWALEED, OBESO Visit Number: [id number] MRN: [id number]          Service Type: CAT Location: [id number] Attending Physician:  Pamella Pert Dictated by:   Delrae Rend, M.D. Proc. Date: 02/21/02 Admit Date:  02/21/2002 Discharge Date: 02/22/2002   CC:         Salomon Mast, M.D., [facility], Fax [phone]             Netta Cedars, M.D., [facility] [place], [place].                        Cardiac Catheterization  PROCEDURES PERFORMED: 1. Percutaneous transluminal ______  angioplasty and balloon angioplasty of    the mid to distal left anterior descending artery and proximal left    anterior descending artery. 2. Percutaneous transluminal coronary angioplasty and stenting of the mid to    distal left anterior descending artery with a 2.5 x 13-mm Cypher stent    deployed at 16 atmospheres of pressure. 3. Intravascular ultrasound interrogation of the left anterior descending    artery. 4. Intracoronary administration of nitroglycerin and Verapamil.  INDICATIONS:  The patient is an 77 year old gentleman with a history of hyperlipidemia who underwent Cardiolite stress test which showed anterolateral myocardial ischemia for which he underwent diagnostic cardiac catheterization by Dr. Kem Boroughs on February 16, 2002.  He was referred to me for evaluation for PTCA of the left anterior descending artery.  IMPRESSION:  Please see report by Dr. Kem Boroughs on February 16, 2002, regarding diagnostic cardiac catheterization.  INTERVENTIONAL DATA: 1. A #8 French Q3.5 guide utilized. 2. Successful PTRA with a 1.5-mm Rotablator bur of the mid to distal left    anterior descending artery.  The stenosis was reduced from 95% to about    50-60%. 3. Successful PTRA of the proximal to mid left anterior descending artery with    a 1.75-mm bur. 4. Successful balloon  angioplasty of the proximal to mid left anterior    descending artery with a 2.5 x 30-mm Maverick balloon performed at 3    atmospheres of pressure. 5. PTCA with a 2.5 x 30-mm balloon in the mid to distal left anterior    descending artery performed at 3 atmospheres of pressure.  The stenosis    was reduced from 95% to still persistent 50-60% stenosis. 6. Successful PTCA and stenting of distal left anterior descending artery    with a 2.5 x 13-mm Cypher stent deployed at 16 atmospheres of pressure for    1 minute.  The total stenosis was reduced from 95% to 0% with TIMI-3 to    TIMI-3 flow with no evidence of dissection, no evidence of thrombus at the    end of the procedure.  The proximal left anterior descending artery    stenosis was reduced from 60-70% to less than 30%.  COMPLICATIONS:  Immediately into post PTRA, patient did have ST segment elevation by 6-lead EKG; however, this rapidly resolved with resolution of chest pain and also the ST segment elevation to back to baseline.  No complications were noted.  The patient remained asymptomatic post procedure. The ST segment elevation was very transient.  INTRAVASCULAR ULTRASOUND INTERROGATION:  The IVUS interrogation of the left anterior descending artery revealed severe stenosis of the mid to distal left anterior descending artery.  This was only a 2.5-mm vessel.  There  was severe calcification seen throughout the left anterior descending artery.  The proximal to mid segment of the left anterior descending artery was also heavily calcified and the lumen approximately measures about 2 mm in diameter. In the ostium of the left anterior descending artery, the vessel measured about 3.5 mm.  Calcification was noted all the way from the apex towards the origin of the left anterior descending artery.  The left main coronary artery had mild calcification and constituting maybe 20% stenosis but had a very large lumen. There was no evidence  of dissection or thrombus noted during intravascular ultrasound interrogation.  RECOMMENDATIONS:  Based on the intravascular ultrasound interrogation, the LAD calcification was noted; hence, PTRA and balloon angioplasty and eventually stenting of the left anterior descending artery was performed.  TECHNIQUE OF PROCEDURE:  Under usual sterile precautions using #8 French 3.5 Q guide was advanced into the ascending aorta over a 0.035-mm J wire.  The left main coronary artery was selective engaged and angiography was performed. Then a 300-cm x 0.014-inch luge wire was advanced to the left anterior descending artery and the tip of the wire was carefully placed into the distal left anterior descending artery.  Intravascular ultrasound interrogation of the left anterior descending artery was performed using a Scimed IVUS catheter.  Then the IVUS catheter was removed out of the body in the usual fashion.  Then the luge wire was exchanged to a 300-cm x 0.009-inch Rotafloppy wire with the help of a transit catheter.  The tip of the wire was again carefully placed in the left anterior descending artery.  Then a 1.5-mm Rota bur was advanced to the left anterior descending artery.  Mechanical rotational atherectomy was performed at 160,000 rpm per minute.  The mid to distal left anterior descending artery stenosis was reduced from 95% to around 50-60%.  Then a 1.75-mm bur was advanced over the same guidewire after removing the 1.5-mm bur.  The bur had significant resistance in the proximal to mid segment of the left anterior descending artery; hence, mechanical rotational atherectomy of the proximal and mid left anterior descending artery was performed.  Then because of technical issues including the Rota bur having been disconnected, the motor driving unit, the bur was withdrawn into the guiding catheter and angiography was performed.  Excellent result was noted with no evidence of dissection, no  evidence of thrombus, and no slow flow state.  The patient did develop transient ST segment elevation in four out of  six leads, especially in the inferior leads in II, III, and aVF. Intracoronary nitroglycerin, intracoronary Verapamil was also administered. This ST segment elevation with the chest pain resolved immediately within 3-4 minutes.  Then during this process, intracoronary nitroglycerin was also administered and angiography was performed.  Then the Rotablator wire and the also the bur was also removed out of the body and a 300-cm x 0.014-inch luge wire was readvanced into the left anterior descending artery and the tip of the wire was carefully positioned in the distal left anterior descending artery.  Then a 2.5 x 30-mm Maverick balloon was advanced over this guidewire and multiple balloon angioplasty at 3 atmospheres of pressure of the mid to distal left anterior descending artery and also proximal to mid segment of the left anterior descending artery was performed for 30-45 seconds.  Then the balloon was deflated and pulled back into the guiding catheter and intracoronary administration of nitroglycerin was done, and angiography was performed.  Excellent result was noted with no  evidence of thrombus or low flow state.  There was still persistence of 60% stenosis in the mid to distal left anterior descending artery; hence, a 2.5 x 13-mm Cypher stent was advanced over this guidewire after removing the balloon and the stent was deployed at 16 atmospheres at maximum pressure PSI and for 1 minute.  The balloon was deflated and pulled back into the guiding catheter and angiography was performed.  Excellent result was noted with overall reduction of stenosis from 95% to 0% with TIMI-3 to TIMI-3 flow with no evidence of thrombus, no evidence of dissection at the end of the procedure.  Hence, the guidewire was removed out of the left anterior descending artery.  Angiography was  performed and the guide catheter was disengaged from the left main coronary artery and pulled out of the body over the 0.035-mm J wire.  The patient tolerated the procedure well.  During the procedure, the ACT was closely maintained at therapeutic range.  Intravenous Integrilin was also administered during the procedure. Dictated by:   Delrae Rend, M.D. Attending Physician:  Pamella Pert DD:  02/21/02 TD:  02/22/02 Job: [id number]

## 2011-01-15 NOTE — Procedures (Signed)
NAME:  Jeffrey [name], Jeffrey [name] NO.:  [id number]   MEDICAL RECORD NO.:  [id number]          PATIENT TYPE:  OUT   LOCATION:  RAD                           FACILITY:  [facility]   PHYSICIAN:  Dani Gobble, MD       DATE OF BIRTH:  August 23, 1918   DATE OF PROCEDURE:  10/27/2005  DATE OF DISCHARGE:                                  ECHOCARDIOGRAM   REFERRING PHYSICIAN:  Dorthula Rue. Early Chars, M.D./Ann Domingo Sep, M.D.   INDICATIONS:  A 77 year old gentleman with known CAD who is referred for  evaluation of a murmur.   The technical quality of the study is somewhat limited secondary to patient  body habitus and poor acoustic windows.   M-MODE TRACINGS:  The aorta measures normally at 3.2 cm.   Left atrium measures normally at 3.8 cm.   The interventricular septum and posterior wall are mild to moderately  thickened in a concentric pattern.   The aortic valve appears to be structurally normal.  No significant aortic  insufficiency is noted.  Doppler interrogation of the aortic valve is within  normal limits.   The mitral valve appears moderately thickened particularly on the anterior  leaflet mitral valve.  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  Would recommend clinical correlation.  The mitral  valve chordae is also thickened.  There is an eccentric jet of mild to  moderate mitral regurgitation skirting along the posterior wall also  consistent with anterior leaflet pathology.  There does appear to be at  least mild mitral valve prolapse of the anterior leaflet.   The pulmonic valve is not well-visualized.   Tricuspid valve appears grossly structurally normal with mild to moderate  tricuspid regurgitation noted.   The left ventricle appears normal in size with LVIDD measured at 3.9 cm and  the LVISD measured at 2.5 cm.  Overall left ventricular  systolic function  is  normal and no regional wall motion abnormalities are noted.  The presence  of diastolic dysfunction is inferred from pulse wave Doppler across the  mitral valve.   Right atrium and right ventricle appear normal in size and right ventricular  systolic function is normal.   IMPRESSION:  1.  Mild to moderate concentric left ventricular hypertrophy.  2.  Mild to moderate thickening of the mitral valve with an appearance most      consistent with myxomatous degeneration, although the possibility of      vegetation cannot be entirely excluded on this study.  The chordae are      also thickened. There is at least mild prolapse of the anterior leaflet      of the mitral valve.  There is an eccentric posteriorly directed jet of      mild to moderate mitral regurgitation also consistent with anterior      leaflet pathology.  3.  Mild to moderate tricuspid regurgitation.  4.  Normal left ventricular size and systolic function without regional wall      motion abnormality noted.  5.  Presence of diastolic dysfunction is inferred from pulse wave Doppler      across the mitral valve.  6.  Would recommend clinical correlation.           ______________________________  Dani Gobble, MD     AB/MEDQ  D:  10/27/2005  T:  10/28/2005  Job:  [id number]

## 2011-01-15 NOTE — Discharge Summary (Signed)
. [facility]  Patient:    Jeffrey [name], Jeffrey [name] Visit Number: [id number] MRN: [id number]          Service Type: CAT Location: [id number] Attending Physician:  Pamella Pert Dictated by:   Abelino Derrick, P.A.C. Admit Date:  02/21/2002 Discharge Date: 02/22/2002   CC:         Dr. Kristian Covey [address] [place], [place] [address]   Discharge Summary  DISCHARGE DIAGNOSES: 1. Coronary disease, elective left anterior descending coronary artery    stenting this admission. 2. Hyperlipidemia. 3. Chronic obstructive pulmonary disease.  HOSPITAL COURSE:  The patient is an 77 year old male followed by Dr. Kristian Covey and Dr. Domingo Sep. He was referred to Dr. Domingo Sep earlier this spring for evaluation of chest pain. He had a Cardiolite study which was abnormal and was set up for a diagnostic catheterization which was done at the heart center on February 16, 2002. This revealed good LV function, 60% distal RCA, 40% circumflex and 95% mid LAD lesion. He was set up for elective intervention.  The patient was admitted on February 21, 2002, and underwent LAD, PTRA and stenting by Dr. Jacinto Halim. He had good results. His enzymes were negative. We felt he could be discharged later on the 26th. Dr. Nadara Eaton has ordered Plavix for three months.  DISCHARGE MEDICATIONS: 1. Plavix 75 mg q.d. x 3 months. 2. Aspirin 81 mg q.d. 3. Lipitor 10 mg q.d. 4. Protonix 40 mg q.d. while on Plavix. 5. Aspirin. 6. Sublingual nitroglycerin p.r.n.  LABORATORY DATA:  White count 6.7, hemoglobin 12.4, hematocrit 36, platelets 136. Sodium 137, potassium 3.8, BUN 12, creatinine 1.0. Outpatient labs showed an INR of 1.1. Urinalysis is unremarkable.  Chest x-ray done in January 2003 showed COPD.  DISPOSITION:  The patient is discharged in stable condition.  FOLLOWUP:  He will follow up with Dr. Domingo Sep on March 05, 2002, at 11:15.  DISCHARGE INSTRUCTIONS:  He has been instructed to not  smoke. He Has been instructed not to engage in any strenuous activity for at least 48 hours. Dictated by:   Abelino Derrick, P.A.C. Attending Physician:  Pamella Pert DD:  02/22/02 TD:  02/23/02 Job: [id number]

## 2011-05-25 LAB — BLOOD GAS, ARTERIAL
FIO2: 0.21
O2 Saturation: 97
pO2, Arterial: 92.1

## 2011-06-03 LAB — URINALYSIS, ROUTINE W REFLEX MICROSCOPIC
Bilirubin Urine: NEGATIVE
Glucose, UA: NEGATIVE mg/dL
Ketones, ur: NEGATIVE mg/dL
Nitrite: NEGATIVE
Specific Gravity, Urine: 1.01 (ref 1.005–1.030)
pH: 6.5 (ref 5.0–8.0)

## 2011-06-03 LAB — CBC
MCHC: 33.8 g/dL (ref 30.0–36.0)
MCV: 92.1 fL (ref 78.0–100.0)
Platelets: 160 10*3/uL (ref 150–400)
RBC: 4.02 MIL/uL — ABNORMAL LOW (ref 4.22–5.81)
RDW: 13.6 % (ref 11.5–15.5)

## 2011-06-03 LAB — URINE MICROSCOPIC-ADD ON

## 2011-06-03 LAB — URINE CULTURE: Colony Count: >=100000

## 2011-06-03 LAB — COMPREHENSIVE METABOLIC PANEL
ALT: 12 U/L (ref 0–53)
AST: 19 U/L (ref 0–37)
Albumin: 3.7 g/dL (ref 3.5–5.2)
Calcium: 9.1 mg/dL (ref 8.4–10.5)
Creatinine, Ser: 0.99 mg/dL (ref 0.4–1.5)
GFR calc Af Amer: >60 mL/min (ref >60)
Sodium: 137 mEq/L (ref 135–145)
Total Protein: 6.9 g/dL (ref 6.0–8.3)

## 2011-06-03 LAB — DIFFERENTIAL
Eosinophils Absolute: 0.1 10*3/uL (ref 0.0–0.7)
Eosinophils Relative: 1 % (ref 0–5)
Lymphocytes Relative: 13 % (ref 12–46)
Lymphs Abs: 1.3 10*3/uL (ref 0.7–4.0)
Monocytes Relative: 4 % (ref 3–12)

## 2011-06-03 LAB — LIPASE, BLOOD: Lipase: 17 U/L (ref 11–59)

## 2011-06-09 LAB — DIFFERENTIAL
Basophils Absolute: 0
Basophils Absolute: 0
Basophils Absolute: 0
Basophils Absolute: 0.1
Basophils Relative: 0
Basophils Relative: 0
Basophils Relative: 0
Basophils Relative: 1
Basophils Relative: 1
Eosinophils Absolute: 0.1
Eosinophils Absolute: 0.2
Eosinophils Absolute: 0.3
Eosinophils Absolute: 0.3
Eosinophils Absolute: 0.3
Eosinophils Relative: 2
Eosinophils Relative: 4
Eosinophils Relative: 4
Eosinophils Relative: 5
Eosinophils Relative: 5
Lymphocytes Relative: 33
Lymphocytes Relative: 35
Lymphs Abs: 2.3
Lymphs Abs: 2.9
Monocytes Absolute: 0.5
Monocytes Absolute: 0.5
Monocytes Absolute: 0.5
Monocytes Absolute: 0.6
Monocytes Relative: 6
Monocytes Relative: 8
Monocytes Relative: 8
Monocytes Relative: 8
Monocytes Relative: 8
Monocytes Relative: 8
Neutro Abs: 3.2
Neutro Abs: 3.8
Neutro Abs: 4.9
Neutro Abs: 5.7
Neutrophils Relative %: 49
Neutrophils Relative %: 51
Neutrophils Relative %: 52
Neutrophils Relative %: 66

## 2011-06-09 LAB — CBC
HCT: 28.2 — ABNORMAL LOW
HCT: 29.2 — ABNORMAL LOW
HCT: 29.6 — ABNORMAL LOW
HCT: 31.2 — ABNORMAL LOW
HCT: 31.2 — ABNORMAL LOW
HCT: 31.7 — ABNORMAL LOW
Hemoglobin: 10 — ABNORMAL LOW
Hemoglobin: 10.2 — ABNORMAL LOW
Hemoglobin: 10.7 — ABNORMAL LOW
Hemoglobin: 10.8 — ABNORMAL LOW
MCHC: 33.9
MCHC: 33.9
MCHC: 34.2
MCHC: 34.3
MCV: 89.4
MCV: 89.4
MCV: 89.9
MCV: 90.5
MCV: 90.7
MCV: 90.8
Platelets: 137 — ABNORMAL LOW
Platelets: 139 — ABNORMAL LOW
Platelets: 149 — ABNORMAL LOW
Platelets: 157
Platelets: 158
RBC: 3.11 — ABNORMAL LOW
RBC: 3.23 — ABNORMAL LOW
RBC: 3.28 — ABNORMAL LOW
RBC: 3.54 — ABNORMAL LOW
RDW: 13.7
RDW: 13.7
RDW: 13.8
RDW: 13.9
RDW: 14
WBC: 7
WBC: 7.9
WBC: 8

## 2011-06-09 LAB — CROSSMATCH
ABO/RH(D): O POS
Antibody Screen: NEGATIVE

## 2011-06-09 LAB — BASIC METABOLIC PANEL
BUN: 18
BUN: 19
CO2: 29
Calcium: 8.7
Calcium: 8.7
Chloride: 104
Chloride: 105
Creatinine, Ser: 0.95
Creatinine, Ser: 1.28
GFR calc Af Amer: 59 — ABNORMAL LOW
GFR calc Af Amer: >60
GFR calc Af Amer: >60
GFR calc non Af Amer: 48 — ABNORMAL LOW
GFR calc non Af Amer: >60
Glucose, Bld: 97
Potassium: 4.1
Potassium: 4.2
Potassium: 4.3
Sodium: 139
Sodium: 141

## 2011-06-09 LAB — COMPREHENSIVE METABOLIC PANEL
ALT: <8
AST: 14
Albumin: 3.2 — ABNORMAL LOW
Calcium: 8.9
GFR calc Af Amer: >60
Sodium: 137
Total Protein: 6.4

## 2011-06-09 LAB — PROTIME-INR: Prothrombin Time: 14.3

## 2011-06-09 LAB — ABO/RH: ABO/RH(D): O POS

## 2011-06-09 LAB — MAGNESIUM: Magnesium: 2.1

## 2011-06-10 LAB — BASIC METABOLIC PANEL
BUN: 18
CO2: 32
Chloride: 103
Creatinine, Ser: 1.09
Glucose, Bld: 111 — ABNORMAL HIGH
Potassium: 4.3

## 2011-06-10 LAB — CBC
HCT: 34.1 — ABNORMAL LOW
MCHC: 34.3
MCV: 89.7
Platelets: 198
RDW: 13.6

## 2011-09-25 ENCOUNTER — Encounter (HOSPITAL_COMMUNITY): Payer: Self-pay | Admitting: *Deleted

## 2011-09-25 ENCOUNTER — Other Ambulatory Visit: Payer: Self-pay

## 2011-09-25 ENCOUNTER — Emergency Department (HOSPITAL_COMMUNITY): Payer: Medicare Other

## 2011-09-25 ENCOUNTER — Emergency Department (HOSPITAL_COMMUNITY)
Admission: EM | Admit: 2011-09-25 | Discharge: 2011-09-26 | Disposition: A | Discharge status: Home / Self Care | Payer: Medicare Other | Attending: Emergency Medicine | Admitting: Emergency Medicine

## 2011-09-25 DIAGNOSIS — R002 Palpitations: Secondary | ICD-10-CM

## 2011-09-25 DIAGNOSIS — F17200 Nicotine dependence, unspecified, uncomplicated: Secondary | ICD-10-CM | POA: Insufficient documentation

## 2011-09-25 DIAGNOSIS — Z7982 Long term (current) use of aspirin: Secondary | ICD-10-CM | POA: Insufficient documentation

## 2011-09-25 DIAGNOSIS — I4510 Unspecified right bundle-branch block: Secondary | ICD-10-CM | POA: Insufficient documentation

## 2011-09-25 LAB — DIFFERENTIAL
Basophils Absolute: 0 10*3/uL (ref 0.0–0.1)
Basophils Relative: 0 % (ref 0–1)
Lymphocytes Relative: 32 % (ref 12–46)
Monocytes Absolute: 0.4 10*3/uL (ref 0.1–1.0)
Neutro Abs: 4 10*3/uL (ref 1.7–7.7)

## 2011-09-25 LAB — COMPREHENSIVE METABOLIC PANEL
BUN: 21 mg/dL (ref 6–23)
Calcium: 9.5 mg/dL (ref 8.4–10.5)
GFR calc Af Amer: 51 mL/min — ABNORMAL LOW (ref >90)
GFR calc non Af Amer: 44 mL/min — ABNORMAL LOW (ref >90)
Glucose, Bld: 187 mg/dL — ABNORMAL HIGH (ref 70–99)
Total Protein: 7.5 g/dL (ref 6.0–8.3)

## 2011-09-25 LAB — LIPASE, BLOOD: Lipase: 21 U/L (ref 11–59)

## 2011-09-25 LAB — POCT I-STAT TROPONIN I

## 2011-09-25 LAB — CBC
HCT: 34.7 % — ABNORMAL LOW (ref 39.0–52.0)
Hemoglobin: 11.6 g/dL — ABNORMAL LOW (ref 13.0–17.0)
MCH: 29.9 pg (ref 26.0–34.0)
MCHC: 33.4 g/dL (ref 30.0–36.0)
MCV: 89.4 fL (ref 78.0–100.0)
Platelets: 165 K/uL (ref 150–400)
RBC: 3.88 MIL/uL — ABNORMAL LOW (ref 4.22–5.81)
RDW: 13.3 % (ref 11.5–15.5)
WBC: 6.7 K/uL (ref 4.0–10.5)

## 2011-09-25 NOTE — ED Provider Notes (Signed)
Scribed for [employer]. Colon Branch, MD, the patient was seen in room APA02/APA02 . This chart was scribed by Ellie Lunch.   CSN: [id number]  Arrival date & time 09/25/11  2223   First MD Initiated Contact with Patient 09/25/11 2308      Chief Complaint  Patient presents with  . Chest Pain    (Consider location/radiation/quality/duration/timing/severity/associated sxs/prior treatment) HPI Pt seen at 11:12 PM  Jeffrey [name] is a 78 y.o. male brought in by ambulance, who presents to the Emergency Department complaining of ~2 months of intermittent palpitations. Pt describes palpitations as a fluttering feeling.  Pt denies frank chest pain, dizziness, nausea. Episodes of fluttering will wake Pt from sleep. Today during an episode Pt felt tired.  Pt has discussed with PCP DonDiego and has started taking blood tinning medications. Pt denies h/o irregular rhythm. Pt denies seeing cardiologist for symptoms.  PCP DonDiego History reviewed. No pertinent past medical history.  History reviewed. No pertinent past surgical history.  History reviewed. No pertinent family history.  History  Substance Use Topics  . Smoking status: Current Some Day Smoker  . Smokeless tobacco: Not on file  . Alcohol Use: Yes      Review of Systems 10 Systems reviewed and are negative for acute change except as noted in the HPI.  Allergies  Review of patient's allergies indicates no known allergies.  Home Medications   Current Outpatient Rx  Name Route Sig Dispense Refill  . ASPIRIN 81 MG PO TABS Oral Take 81 mg by mouth daily.      BP 128/62  Pulse 76  Temp(Src) 98.2 F (36.8 C) (Oral)  Resp 18  Ht 5\' 11"  (1.803 m)  Wt 135 lb (61.236 kg)  BMI 18.83 kg/m2  SpO2 98%  Physical Exam  Nursing note and vitals reviewed. Constitutional: He is oriented to person, place, and time. He appears well-developed and well-nourished. No distress.  HENT:  Head: Normocephalic and atraumatic.  Right Ear:  External ear normal.  Left Ear: External ear normal.  Eyes: EOM are normal. Pupils are equal, round, and reactive to light.  Neck: Normal range of motion. Neck supple.  Cardiovascular: Normal rate, regular rhythm and normal heart sounds.  Exam reveals no gallop and no friction rub.   No murmur heard. Pulmonary/Chest: Effort normal and breath sounds normal. No respiratory distress.  Abdominal: Soft. Bowel sounds are normal. There is no tenderness.  Musculoskeletal: He exhibits no edema.  Neurological: He is alert and oriented to person, place, and time.  Skin: Skin is dry.  Psychiatric: He has a normal mood and affect.    ED Course  Procedures (including critical care time) Results for orders placed during the hospital encounter of 09/25/11  CBC      Component Value Range   WBC 6.7  4.0 - 10.5 (K/uL)   RBC 3.88 (*) 4.22 - 5.81 (MIL/uL)   Hemoglobin 11.6 (*) 13.0 - 17.0 (g/dL)   HCT 78.4 (*) 69.6 - 52.0 (%)   MCV 89.4  78.0 - 100.0 (fL)   MCH 29.9  26.0 - 34.0 (pg)   MCHC 33.4  30.0 - 36.0 (g/dL)   RDW 29.5  28.4 - 13.2 (%)   Platelets 165  150 - 400 (K/uL)  DIFFERENTIAL      Component Value Range   Neutrophils Relative 60  43 - 77 (%)   Neutro Abs 4.0  1.7 - 7.7 (K/uL)   Lymphocytes Relative 32  12 - 46 (%)  Lymphs Abs 2.2  0.7 - 4.0 (K/uL)   Monocytes Relative 6  3 - 12 (%)   Monocytes Absolute 0.4  0.1 - 1.0 (K/uL)   Eosinophils Relative 2  0 - 5 (%)   Eosinophils Absolute 0.1  0.0 - 0.7 (K/uL)   Basophils Relative 0  0 - 1 (%)   Basophils Absolute 0.0  0.0 - 0.1 (K/uL)  COMPREHENSIVE METABOLIC PANEL      Component Value Range   Sodium 136  135 - 145 (mEq/L)   Potassium 4.0  3.5 - 5.1 (mEq/L)   Chloride 99  96 - 112 (mEq/L)   CO2 27  19 - 32 (mEq/L)   Glucose, Bld 187 (*) 70 - 99 (mg/dL)   BUN 21  6 - 23 (mg/dL)   Creatinine, Ser 1.61 (*) 0.50 - 1.35 (mg/dL)   Calcium 9.5  8.4 - 09.6 (mg/dL)   Total Protein 7.5  6.0 - 8.3 (g/dL)   Albumin 3.9  3.5 - 5.2 (g/dL)    AST 16  0 - 37 (U/L)   ALT 7  0 - 53 (U/L)   Alkaline Phosphatase 57  39 - 117 (U/L)   Total Bilirubin 0.3  0.3 - 1.2 (mg/dL)   GFR calc non Af Amer 44 (*) >90 (mL/min)   GFR calc Af Amer 51 (*) >90 (mL/min)  LIPASE, BLOOD      Component Value Range   Lipase 21  11 - 59 (U/L)  POCT I-STAT TROPONIN I      Component Value Range   Troponin i, poc 0.01  0.00 - 0.08 (ng/mL)   Comment 3             Dg Chest Portable 1 View  09/25/2011  *RADIOLOGY REPORT*  Clinical Data: Chest pain  PORTABLE CHEST - 1 VIEW  Comparison: 09/16/2008  Findings: Mild cardiac enlargement with normal pulmonary vascularity.  Emphysematous changes and scattered fibrosis in the lungs.  No focal airspace consolidation.  No blunting of costophrenic angles.  No pneumothorax.  Calcification and torsion of the aorta.  No significant change since previous study.  IMPRESSION: Mild cardiac enlargement.  Emphysematous changes and scattered fibrosis in the lungs.  No evidence of active pulmonary disease.  Original Report Authenticated By: Marlon Pel, M.D.    Date: 09/26/2011  2235  Rate:71  Rhythm: normal sinus rhythm  QRS Axis: normal  Intervals: normal  ST/T Wave abnormalities: normal  Conduction Disutrbances:right bundle branch block  Narrative Interpretation:   Old EKG Reviewed: none available  11:18 PM Pt discussed with Pt and family diagnostic possibilities including palpitations and atrial fibrillation. Discussed care plain including chest x ray, CMP, troponin, lipase, and CBC.     MDM  Patient with episodes of palpitations over the past three months, however no palpitations while in the ER even with ambulation.  Labs were unremarkable, troponin negative, EKG without acute findings.Pt stable in ED with no significant deterioration in condition.The patient appears reasonably screened and/or stabilized for discharge and I doubt any other medical condition or other [facility] requiring further screening, evaluation, or  treatment in the ED at this time prior to discharge.   I personally performed the services described in this documentation, which was scribed in my presence. The recorded information has been reviewed and considered.  MDM Reviewed: previous chart, nursing note and vitals Interpretation: labs, ECG and x-ray           Nicoletta Dress. Colon Branch, MD 09/26/11 [phone]

## 2011-09-25 NOTE — ED Notes (Signed)
Pt ambulated around the ed. Pt states does not feel any "vibration" or change w/ walking. Placed back on cardiaic monitor showing NSR. No change from pre ambulation.

## 2011-09-25 NOTE — ED Notes (Signed)
Pt states he has some chest pain on & off. States pain states he has no pain at present, took an Youth worker just before coming & pain has stopped.

## 2011-09-26 NOTE — ED Notes (Signed)
Pt alert & oriented x4, stable gait. Pt given discharge instructions, paperwork, pt verbalized understanding. Pt left department w/ no further questions.  

## 2012-10-06 ENCOUNTER — Encounter (HOSPITAL_COMMUNITY): Payer: Self-pay | Admitting: Emergency Medicine

## 2012-10-06 ENCOUNTER — Emergency Department (HOSPITAL_COMMUNITY)
Admission: EM | Admit: 2012-10-06 | Discharge: 2012-10-06 | Disposition: A | Discharge status: Home / Self Care | Payer: Medicare Other | Attending: Emergency Medicine | Admitting: Emergency Medicine

## 2012-10-06 ENCOUNTER — Emergency Department (HOSPITAL_COMMUNITY): Payer: Medicare Other

## 2012-10-06 DIAGNOSIS — R002 Palpitations: Secondary | ICD-10-CM

## 2012-10-06 DIAGNOSIS — Z7982 Long term (current) use of aspirin: Secondary | ICD-10-CM | POA: Insufficient documentation

## 2012-10-06 DIAGNOSIS — I252 Old myocardial infarction: Secondary | ICD-10-CM | POA: Insufficient documentation

## 2012-10-06 DIAGNOSIS — Z8679 Personal history of other diseases of the circulatory system: Secondary | ICD-10-CM | POA: Insufficient documentation

## 2012-10-06 DIAGNOSIS — F17200 Nicotine dependence, unspecified, uncomplicated: Secondary | ICD-10-CM | POA: Insufficient documentation

## 2012-10-06 HISTORY — DX: Old myocardial infarction: I25.2

## 2012-10-06 LAB — CBC WITH DIFFERENTIAL/PLATELET
Basophils Absolute: 0 10*3/uL (ref 0.0–0.1)
Eosinophils Absolute: 0.2 10*3/uL (ref 0.0–0.7)
Eosinophils Relative: 3 % (ref 0–5)
Lymphs Abs: 1.9 10*3/uL (ref 0.7–4.0)
MCH: 30.1 pg (ref 26.0–34.0)
Neutrophils Relative %: 58 % (ref 43–77)
Platelets: 149 10*3/uL — ABNORMAL LOW (ref 150–400)
RBC: 3.65 MIL/uL — ABNORMAL LOW (ref 4.22–5.81)
RDW: 13.2 % (ref 11.5–15.5)
WBC: 5.7 10*3/uL (ref 4.0–10.5)

## 2012-10-06 LAB — BASIC METABOLIC PANEL
Calcium: 9 mg/dL (ref 8.4–10.5)
Creatinine, Ser: 1.19 mg/dL (ref 0.50–1.35)
GFR calc non Af Amer: 51 mL/min — ABNORMAL LOW (ref >90)
Glucose, Bld: 141 mg/dL — ABNORMAL HIGH (ref 70–99)
Sodium: 139 mEq/L (ref 135–145)

## 2012-10-06 LAB — TROPONIN I: Troponin I: <0.3 ng/mL (ref <0.30)

## 2012-10-06 MED ORDER — ASPIRIN 325 MG PO TABS
325.0000 mg | ORAL_TABLET | Freq: Once | ORAL | Status: AC
  Administered 2012-10-06: 325 mg via ORAL
  Filled 2012-10-06: qty 1

## 2012-10-06 NOTE — ED Notes (Signed)
Please call pt daughter with test results/disposition, Tracey [phone].

## 2012-10-06 NOTE — ED Provider Notes (Signed)
History   This chart was scribed for Joya Gaskins, MD by Charolett Bumpers, ED Scribe. The patient was seen in room APA01/APA01. Patient's care was started at 1346.   CSN: [id number]  Arrival date & time 10/06/12  1338   First MD Initiated Contact with Patient 10/06/12 1346      Chief Complaint  Patient presents with  . Chest Pain    The history is provided by the patient. No language interpreter was used.  Jeffrey [name] is a 79 y.o. male who presents to the Emergency Department complaining of intermittent moderate chest pain described as fluttering that started yesterday. He reports the episodes are brief and denies any associated symptoms when the episodes occur. He denies any diaphoresis, SOB, nausea, vomiting, syncope, abdominal pain, generalized or focal weakness. He states that he is currently not having chest pain. He has a h/o MI a few years ago.   PCP: Dr. Janna Arch Cardiologist: Tresa Moore   Past Medical History  Diagnosis Date  . Heart disease   . MI, old     History reviewed. No pertinent past surgical history.  History reviewed. No pertinent family history.  History  Substance Use Topics  . Smoking status: Current Some Day Smoker  . Smokeless tobacco: Not on file  . Alcohol Use: Yes      Review of Systems  Constitutional: Negative for fever and diaphoresis.  Respiratory: Negative for shortness of breath.   Cardiovascular: Positive for chest pain.  Gastrointestinal: Negative for nausea, vomiting and abdominal pain.  Neurological: Negative for syncope and weakness.  All other systems reviewed and are negative.    Allergies  Review of patient's allergies indicates no known allergies.  Home Medications   Current Outpatient Rx  Name  Route  Sig  Dispense  Refill  . ASPIRIN 81 MG PO TABS   Oral   Take 81 mg by mouth daily.           Triage Vitals: BP 151/74  Pulse 70  Temp 98.2 F (36.8 C)  Resp 18  Ht 5\' 11"  (1.803 m)  Wt 135  lb (61.236 kg)  BMI 18.83 kg/m2  SpO2 97%  Physical Exam CONSTITUTIONAL: Well developed/well nourished HEAD AND FACE: Normocephalic/atraumatic EYES: EOMI ENMT: Mucous membranes moist NECK: supple no meningeal signs SPINE:entire spine nontender CV: S1/S2 noted,murmur noted LUNGS: Lungs are clear to auscultation bilaterally, no apparent distress ABDOMEN: soft, nontender, no rebound or guarding GU:no cva tenderness NEURO: Pt is awake/alert, moves all extremitiesx4 EXTREMITIES: pulses normal, full ROM SKIN: warm, color normal PSYCH: no abnormalities of mood noted  ED Course  Procedures   DIAGNOSTIC STUDIES: Oxygen Saturation is 97% on room air, adequate by my interpretation.    COORDINATION OF CARE:  13:55-Discussed planned course of treatment with the patient including aspirin, chest x-ray and basic blood work, who is agreeable at this time.   14:00-Medication Orders: Aspirin tablet 325 mg-once  Pt well appearing.  He only described fluttering type symptoms, seems more c/w palpitations.  Per chart, he did have episodes of afib previously, but none noted on this EKG.  He could have had paroxysmal afib at home.  I doubt ACS/PE.  He is very well appearing and did not have any associated symptoms.  I have spoken to his cardiologist, Dr Alanda Amass and f/u has been arranged.  HE did have murmur on exam, though per records has h/o mitral regurg, I doubt this is acute.   MDM  Nursing notes including past  medical history and social history reviewed and considered in documentation xrays reviewed and considered Labs/vital reviewed and considered Previous records reviewed and considered - h/o CAD in the past      Date: 10/06/2012  Rate: 64  Rhythm: normal sinus rhythm  QRS Axis: normal  Intervals: normal  ST/T Wave abnormalities: nonspecific ST changes  Conduction Disutrbances:right bundle branch block  Narrative Interpretation:   Old EKG Reviewed: unchanged from 09/25/11    I  personally performed the services described in this documentation, which was scribed in my presence. The recorded information has been reviewed and is accurate.         Joya Gaskins, MD 10/06/12 [phone]

## 2012-10-06 NOTE — ED Notes (Signed)
Pt c/o "flutter in chest" intermittently since yesterday. Denies pain at this time.

## 2013-02-28 ENCOUNTER — Ambulatory Visit (INDEPENDENT_AMBULATORY_CARE_PROVIDER_SITE_OTHER): Payer: Medicare Other | Admitting: Physician Assistant

## 2013-02-28 ENCOUNTER — Encounter: Payer: Self-pay | Admitting: Physician Assistant

## 2013-02-28 VITALS — BP 130/64 | HR 64 | Temp 97.6°F | Resp 12 | Ht 77.0 in | Wt 190.0 lb

## 2013-02-28 DIAGNOSIS — R011 Cardiac murmur, unspecified: Secondary | ICD-10-CM

## 2013-02-28 DIAGNOSIS — I2510 Atherosclerotic heart disease of native coronary artery without angina pectoris: Secondary | ICD-10-CM

## 2013-02-28 DIAGNOSIS — R002 Palpitations: Secondary | ICD-10-CM

## 2013-02-28 DIAGNOSIS — R55 Syncope and collapse: Secondary | ICD-10-CM

## 2013-03-01 LAB — COMPLETE METABOLIC PANEL WITH GFR
ALT: <8 U/L (ref 0–53)
AST: 13 U/L (ref 0–37)
Albumin: 3.6 g/dL (ref 3.5–5.2)
Alkaline Phosphatase: 52 U/L (ref 39–117)
BUN: 18 mg/dL (ref 6–23)
Calcium: 8.7 mg/dL (ref 8.4–10.5)
Chloride: 105 mEq/L (ref 96–112)
Potassium: 3.8 mEq/L (ref 3.5–5.3)
Sodium: 139 mEq/L (ref 135–145)

## 2013-03-01 LAB — CBC WITH DIFFERENTIAL/PLATELET
Basophils Absolute: 0 10*3/uL (ref 0.0–0.1)
Basophils Relative: 0 % (ref 0–1)
HCT: 30.8 % — ABNORMAL LOW (ref 39.0–52.0)
Lymphocytes Relative: 28 % (ref 12–46)
MCHC: 34.1 g/dL (ref 30.0–36.0)
Neutro Abs: 3.5 10*3/uL (ref 1.7–7.7)
Neutrophils Relative %: 65 % (ref 43–77)
Platelets: 165 10*3/uL (ref 150–400)
RDW: 14 % (ref 11.5–15.5)
WBC: 5.4 10*3/uL (ref 4.0–10.5)

## 2013-03-01 LAB — TSH: TSH: 0.851 u[IU]/mL (ref 0.350–4.500)

## 2013-03-04 NOTE — Progress Notes (Signed)
Patient ID: Jeffrey [name] MRN: [id number], DOB: [birth year], 79 y.o. Date of Encounter: @DATE @  Chief Complaint:  Chief Complaint  Patient presents with  . Establish Care    Probablems with legs and dizziness  ????Neuro problems    HPI: 79 y.o. year old AA male  presents with his daughter and ?wife( I never clearified this relationship). The daughter did introduce herself and she explained thatshe was the one that insisted pt come to our office b/c she was concerned about "spells he was having recently."  They report that he had been seeing a PCP Dr. Janna Arch in [place]. Says he last saw him 3 months ago. They say he has also seen Cardiologist in past-pt thinks LOV there was about a year ago.(?)  They report that he has "spells" where he becomes "lightheaded and his legs give out from under him." Some days he has no problems, no episodes. Presumed wife reports that "earlier this week, at the grocery store, he had a spell"-Pt says he feltdizzy, his legs went weak. Wife says he starteto fall to the ground but people nearby helped catch him. He reports that he had no CP and no Palpitations at that time.  He says he does feel his heart race at times but does not feel that at the times of the presyncope.    Past Medical History  Diagnosis Date  . Heart disease   . MI, old      Home Meds: See attached medication section for current medication list. Any medications entered into computer today will not appear on this note's list. The medications listed below were entered prior to today. No current outpatient prescriptions on file prior to visit.   No current facility-administered medications on file prior to visit.    Allergies: No Known Allergies  History   Social History  . Marital Status: Widowed    Spouse Name: N/A    Number of Children: N/A  . Years of Education: N/A   Occupational History  . Not on file.   Social History Main Topics  . Smoking status: Current Some Day  Smoker  . Smokeless tobacco: Not on file  . Alcohol Use: Yes  . Drug Use: No  . Sexually Active:    Other Topics Concern  . Not on file   Social History Narrative  . No narrative on file    No family history on file.   Review of Systems:  See HPI for pertinent ROS. All other ROS negative.    Physical Exam: Blood pressure 130/64, pulse 64, temperature 97.6 F (36.4 C), temperature source Oral, resp. rate 12, height 6\' 5"  (1.956 m), weight 190 lb (86.183 kg)., Body mass index is 22.53 kg/(m^2). General: THin Elderly AAM sitting in wheelchair. Appears in no acute distress. Lungs: Clear bilaterally to auscultation without wheezes, rales, or rhonchi. Breathing is unlabored. Heart: RRR with S1 S2. IV/VI murmur at apex.  Abdomen: Unable to fully examine as he is in wheelchair.  Musculoskeletal:  Strength and tone normal for age.Thin, elderly.  Extremities/Skin: Warm and dry.  No edema.  Neuro: Alert and oriented X 3.  CNII-XII grossly in tact. Psych:  Responds to questions appropriately with a normal affect.   EKG: NSR @ 78 bpm. PVCs. Q in II, III, AVF c/w old inferior MI. RBBB. Nonspecific ST/T changes.   ASSESSMENT AND PLAN:  79 y.o. year old male with  1. Palpitations - Ambulatory referral to Cardiology  2. Pre-syncope - CBC with  Differential - COMPLETE METABOLIC PANEL WITH GFR - TSH - Ambulatory referral to Cardiology  3. Undiagnosed cardiac murmurs - Ambulatory referral to Cardiology  4. Coronary artery disease - Ambulatory referral to Cardiology  I considered ordering a monitor and echo, but he is going to need f/u with cardiology regardless so will just let them arrange so they can review tests themselves. Will check labs to R/O other medical causes as well.   [address] [place], [place], [facility] 03/04/2013 4:05 PM

## 2013-03-05 ENCOUNTER — Other Ambulatory Visit: Payer: Self-pay | Admitting: Physician Assistant

## 2013-03-05 DIAGNOSIS — R002 Palpitations: Secondary | ICD-10-CM

## 2013-03-07 ENCOUNTER — Ambulatory Visit: Payer: Medicare Other | Admitting: Cardiology

## 2013-03-08 ENCOUNTER — Encounter: Payer: Self-pay | Admitting: Family Medicine

## 2013-03-15 ENCOUNTER — Other Ambulatory Visit: Payer: Self-pay | Admitting: Cardiovascular Disease

## 2013-03-15 ENCOUNTER — Other Ambulatory Visit: Payer: Self-pay | Admitting: *Deleted

## 2013-03-15 DIAGNOSIS — I059 Rheumatic mitral valve disease, unspecified: Secondary | ICD-10-CM

## 2013-03-15 LAB — CBC WITH DIFFERENTIAL/PLATELET
Basophils Relative: 0 % (ref 0–1)
Eosinophils Relative: 1 % (ref 0–5)
HCT: 30.6 % — ABNORMAL LOW (ref 39.0–52.0)
Hemoglobin: 10.4 g/dL — ABNORMAL LOW (ref 13.0–17.0)
Lymphocytes Relative: 38 % (ref 12–46)
MCHC: 34 g/dL (ref 30.0–36.0)
MCV: 85.2 fL (ref 78.0–100.0)
Monocytes Absolute: 0.3 10*3/uL (ref 0.1–1.0)
Monocytes Relative: 6 % (ref 3–12)
Neutro Abs: 3 10*3/uL (ref 1.7–7.7)
RDW: 13.7 % (ref 11.5–15.5)

## 2013-03-16 ENCOUNTER — Encounter: Payer: Self-pay | Admitting: Cardiovascular Disease

## 2013-03-16 LAB — VITAMIN B12: Vitamin B-12: 209 pg/mL — ABNORMAL LOW (ref 211–911)

## 2013-03-16 LAB — COMPREHENSIVE METABOLIC PANEL
Albumin: 3.7 g/dL (ref 3.5–5.2)
BUN: 15 mg/dL (ref 6–23)
CO2: 27 mEq/L (ref 19–32)
Glucose, Bld: 88 mg/dL (ref 70–99)
Potassium: 4.1 mEq/L (ref 3.5–5.3)
Sodium: 139 mEq/L (ref 135–145)
Total Protein: 6.2 g/dL (ref 6.0–8.3)

## 2013-03-16 LAB — TSH: TSH: 1.325 u[IU]/mL (ref 0.350–4.500)

## 2013-03-21 ENCOUNTER — Ambulatory Visit (HOSPITAL_COMMUNITY): Admission: RE | Admit: 2013-03-21 | Payer: Medicare Other | Source: Ambulatory Visit

## 2013-03-28 ENCOUNTER — Ambulatory Visit (INDEPENDENT_AMBULATORY_CARE_PROVIDER_SITE_OTHER): Payer: Medicare Other | Admitting: Physician Assistant

## 2013-03-28 ENCOUNTER — Encounter: Payer: Self-pay | Admitting: Physician Assistant

## 2013-03-28 VITALS — BP 124/76 | HR 72 | Temp 98.1°F | Resp 18 | Wt 128.0 lb

## 2013-03-28 DIAGNOSIS — E785 Hyperlipidemia, unspecified: Secondary | ICD-10-CM

## 2013-03-28 DIAGNOSIS — I2510 Atherosclerotic heart disease of native coronary artery without angina pectoris: Secondary | ICD-10-CM

## 2013-03-28 DIAGNOSIS — R011 Cardiac murmur, unspecified: Secondary | ICD-10-CM

## 2013-03-28 DIAGNOSIS — R55 Syncope and collapse: Secondary | ICD-10-CM | POA: Insufficient documentation

## 2013-03-28 DIAGNOSIS — I10 Essential (primary) hypertension: Secondary | ICD-10-CM

## 2013-03-28 NOTE — Progress Notes (Signed)
   Patient ID: Jeffrey [name] MRN: [id number], DOB: 19-Nov-1917, 79 y.o. Date of Encounter: 03/28/2013, 1:23 PM    Chief Complaint:  Chief Complaint  Patient presents with  . here for 4 week follow up     HPI: 19 y.o. year old AA male here with his girlfriend. He lives alone. He actually drives. She just comes along with him to visit.  I reviewed LOV note. No note by Cardiology was in Epic. We then called [facility] [place] office-obtained OV note by Dr. Alanda Amass. Reviewed that.  Apparently pt was sched for Echo but pt went to wrong location or something-???Either way, echo has not ben done.  As well, Dr. Mancel Parsons note doesnot reflect concern of presyncope or the murmur.   Pt and girlfriend report pt continuing to have episodes of presyncope. Still, no frank syncope.  No chest pressure, heaviness, tightness, or squeezing.   Home Meds: See attached medication section for any medications that were entered at today's visit. The computer does not put those onto this list.The following list is a list of meds entered prior to today's visit.   No current outpatient prescriptions on file prior to visit.   No current facility-administered medications on file prior to visit.    Allergies: No Known Allergies    Review of Systems: See HPI for pertinent ROS. All other ROS negative.    Physical Exam: Blood pressure 124/76, pulse 72, temperature 98.1 F (36.7 C), temperature source Oral, resp. rate 18, weight 128 lb (58.06 kg)., Body mass index is 15.18 kg/(m^2). General: Thin AAM Appears in no acute distress. Neck: Supple. No thyromegaly. No lymphadenopathy.No definite carotid bruit. Lungs: Clear bilaterally to auscultation without wheezes, rales, or rhonchi. Breathing is unlabored. Heart: Regular rhythm. IV/VI murmur at apex. Abdomen: Soft, non-tender, non-distended with normoactive bowel sounds. No hepatomegaly. No rebound/guarding. No obvious abdominal masses. Msk:  Strength and tone  normal for age. Extremities/Skin: Warm and dry. No edema. Neuro: Alert and oriented X 3. Moves all extremities spontaneously. Gait is normal. CNII-XII grossly in tact. Psych:  Responds to questions appropriately with a normal affect.     ASSESSMENT AND PLAN:  79 y.o. year old male with  1. Pre-syncope - Ambulatory referral to Cardiology 2. Murmur 3. CAD - Ambulatory referral to Cardiology 4. HYPERLIPIDEMIA 5. HYPERTENSION  We have gotten him scheduled for Echo at [facility] for tomorrow at 3:00 We have scheduled him to f/u with [facility] Cardiology-appt Aug 28th.  A copy of Dr.Weintraubs note is being faxed to them so they will have this info as pt has had Cardiac Cath at [facility] in past.**  These appointments were scheduled while pt in office so HOPEFULLY he and girlfriend will not get confused!!! He will sched appt to f/u with me the week after Aug 28 so I can monitor things.   [address] [place], [place], [facility] 03/28/2013 1:23 PM

## 2013-03-29 ENCOUNTER — Ambulatory Visit (HOSPITAL_COMMUNITY)
Admission: RE | Admit: 2013-03-29 | Discharge: 2013-03-29 | Disposition: A | Discharge status: Home / Self Care | Payer: Medicare Other | Source: Ambulatory Visit | Attending: Cardiovascular Disease | Admitting: Cardiovascular Disease

## 2013-03-29 DIAGNOSIS — R011 Cardiac murmur, unspecified: Secondary | ICD-10-CM | POA: Insufficient documentation

## 2013-03-29 DIAGNOSIS — I10 Essential (primary) hypertension: Secondary | ICD-10-CM | POA: Insufficient documentation

## 2013-03-29 DIAGNOSIS — E785 Hyperlipidemia, unspecified: Secondary | ICD-10-CM | POA: Insufficient documentation

## 2013-03-29 DIAGNOSIS — I059 Rheumatic mitral valve disease, unspecified: Secondary | ICD-10-CM | POA: Insufficient documentation

## 2013-04-26 ENCOUNTER — Encounter: Payer: Self-pay | Admitting: Cardiology

## 2013-04-26 ENCOUNTER — Ambulatory Visit: Payer: Medicare Other | Admitting: Cardiology

## 2013-05-07 ENCOUNTER — Ambulatory Visit: Payer: Medicare Other | Admitting: Physician Assistant

## 2013-05-24 ENCOUNTER — Encounter: Payer: Self-pay | Admitting: Cardiovascular Disease

## 2013-05-24 ENCOUNTER — Ambulatory Visit (INDEPENDENT_AMBULATORY_CARE_PROVIDER_SITE_OTHER): Payer: Medicare Other | Admitting: Cardiovascular Disease

## 2013-05-24 ENCOUNTER — Encounter: Payer: Self-pay | Admitting: Physician Assistant

## 2013-05-24 ENCOUNTER — Ambulatory Visit (INDEPENDENT_AMBULATORY_CARE_PROVIDER_SITE_OTHER): Payer: Medicare Other | Admitting: Physician Assistant

## 2013-05-24 ENCOUNTER — Other Ambulatory Visit: Payer: Self-pay | Admitting: Cardiology

## 2013-05-24 VITALS — BP 88/48 | HR 84 | Ht 72.0 in | Wt 126.0 lb

## 2013-05-24 VITALS — BP 162/94 | HR 64 | Temp 97.7°F | Resp 18 | Wt 125.0 lb

## 2013-05-24 DIAGNOSIS — I340 Nonrheumatic mitral (valve) insufficiency: Secondary | ICD-10-CM | POA: Insufficient documentation

## 2013-05-24 DIAGNOSIS — Z955 Presence of coronary angioplasty implant and graft: Secondary | ICD-10-CM

## 2013-05-24 DIAGNOSIS — R634 Abnormal weight loss: Secondary | ICD-10-CM

## 2013-05-24 DIAGNOSIS — R55 Syncope and collapse: Secondary | ICD-10-CM

## 2013-05-24 DIAGNOSIS — I059 Rheumatic mitral valve disease, unspecified: Secondary | ICD-10-CM

## 2013-05-24 DIAGNOSIS — I6529 Occlusion and stenosis of unspecified carotid artery: Secondary | ICD-10-CM

## 2013-05-24 DIAGNOSIS — D649 Anemia, unspecified: Secondary | ICD-10-CM

## 2013-05-24 DIAGNOSIS — I6523 Occlusion and stenosis of bilateral carotid arteries: Secondary | ICD-10-CM

## 2013-05-24 DIAGNOSIS — E785 Hyperlipidemia, unspecified: Secondary | ICD-10-CM

## 2013-05-24 DIAGNOSIS — I10 Essential (primary) hypertension: Secondary | ICD-10-CM

## 2013-05-24 DIAGNOSIS — I2510 Atherosclerotic heart disease of native coronary artery without angina pectoris: Secondary | ICD-10-CM

## 2013-05-24 DIAGNOSIS — I959 Hypotension, unspecified: Secondary | ICD-10-CM

## 2013-05-24 DIAGNOSIS — Z9861 Coronary angioplasty status: Secondary | ICD-10-CM

## 2013-05-24 DIAGNOSIS — I658 Occlusion and stenosis of other precerebral arteries: Secondary | ICD-10-CM

## 2013-05-24 MED ORDER — PRAVASTATIN SODIUM 40 MG PO TABS: 40.0000 mg | ORAL_TABLET | Freq: Every day | ORAL | Status: DC

## 2013-05-24 NOTE — Patient Instructions (Addendum)
Your physician recommends that you schedule a follow-up appointment in: 3 months with Dr. Purvis Sheffield. You will schedule this appointment today before you leave.   Your physician has recommended you make the following change in your medication:  Stop: Cilostazol (Pletal) Start: Aspirin 81 MG once daily.  Continue all other medications the same.

## 2013-05-24 NOTE — Progress Notes (Signed)
Patient ID: Jeffrey [name] MRN: [id number], DOB: Jun 13, 1918, 79 y.o. Date of Encounter: @DATE @  Chief Complaint:  Chief Complaint  Patient presents with  . 2 mth follow up     HPI: 79 y.o. year old AA male  presents with his son-in-law today for his visit . I had never met his son-in-law prior to today.. he was not with him at either of the 2 prior visits. He says that he talks to the patient on the phone every other day but does not physically see him very frequently. Patient has no new complaints today. As well the son-in-law has no complaints or input.   Past Medical History  Diagnosis Date  . Heart disease   . MI, old   . Mitral valvular regurgitation 05/24/2013     Home Meds: See attached medication section for current medication list. Any medications entered into computer today will not appear on this note's list. The medications listed below were entered prior to today. Current Outpatient Prescriptions on File Prior to Visit  Medication Sig Dispense Refill  . metoprolol succinate (TOPROL-XL) 25 MG 24 hr tablet Take 25 mg by mouth daily.      . pravastatin (PRAVACHOL) 40 MG tablet Take 40 mg by mouth at bedtime.      . cilostazol (PLETAL) 50 MG tablet Take 50 mg by mouth 2 (two) times daily.       No current facility-administered medications on file prior to visit.    Allergies: No Known Allergies  History   Social History  . Marital Status: Widowed    Spouse Name: N/A    Number of Children: N/A  . Years of Education: N/A   Occupational History  . Not on file.   Social History Main Topics  . Smoking status: Current Some Day Smoker  . Smokeless tobacco: Not on file  . Alcohol Use: Yes  . Drug Use: No  . Sexual Activity:    Other Topics Concern  . Not on file   Social History Narrative  . No narrative on file    No family history on file.   Review of Systems:  See HPI for pertinent ROS. All other ROS negative.    Physical Exam: Blood pressure  162/94, pulse 64, temperature 97.7 F (36.5 C), temperature source Oral, resp. rate 18, weight 125 lb (56.7 kg)., Body mass index is 14.82 kg/(m^2). General: Extremely thin African American male. Appears in no acute distress. Neck: Supple. No thyromegaly. No lymphadenopathy. Lungs: Clear bilaterally to auscultation without wheezes, rales, or rhonchi. Breathing is unlabored. Heart: RRR . IV/VI murmur at apex. Abdomen: Soft, non-tender, non-distended with normoactive bowel sounds. No hepatomegaly. No rebound/guarding. No obvious abdominal masses. Musculoskeletal:  Strength and tone normal for age. Extremely thin. Extremities/Skin: Warm and dry. No clubbing or cyanosis. No edema. Neuro: Alert and oriented X 3. Moves all extremities spontaneously. Gait is normal. CNII-XII grossly in tact. Psych:  Responds to questions appropriately with a normal affect.     ASSESSMENT AND PLAN:  79 y.o. year old male with  1. Pre-syncope Patient has had 2 office visits with me prior to today's visit. His prior 2 visits were dated 03/04/13 and 03/28/13. When he came here for his initial visit on 03/04/13 his daughter was with him. She explained that she was the one that insisted that the patient come to my office because she was concerned about "spells he was having recently". Reported these spells at which time he would be  lightheaded and his legs would give out from under him. When I saw patient back for followup office visit on 03/28/13 there were no cardiology notes in epic. I then learned that he had seen Dr. Alanda Amass. We obtained Dr. Mancel Parsons office note while the patient was here for his visit that day. However his office note made no mention of a murmur and no mention of presyncopal episodes.  I then scheduled the patient to be seen at [facility] cardiology. While the patient was kept in my office that day we made an appointment for him to be seen at low-power cardiology August 28.  As well I scheduled him for an  echo to be done at any pin the following day at 3:00. We gave him both of these appointments while he was here in the office of these were not get confused.   Today he is here for followup and I do have the echocardiogram report which was performed 03/29/13. This did show mitral regurgitation which is at least moderate in severity. No other significant abnormalities on echo . However patient did not followup with the cardiology appointment. He has had no office visit with a cardiologist since the last visit with me.  His son-in-law who is here today promises me that if we make him 1 more appointment with cardiology he will make certain that the patient gets to the appointment. Appointment scheduled for low-power cardiology at the [facility] office while the patient was still here today. - Ambulatory referral to Cardiology  2. Mitral valvular regurgitation See # 1 above. - Ambulatory referral to Cardiology  3. WEIGHT LOSS Patient is extremely thin. Son-in-law tells me the patient has a girlfriend who is with him all the time and that they eat meals together.   4. HYPERLIPIDEMIA On Pravachol. Once again today he is not fasting so we not cannot repeat his lipid panel. However liver function tests have been normal.  5. ANEMIA, NORMOCYTIC I reviewed labs have been done by me. These have not revealed anemia. Hemoccult was done by me in the office visit today and was negative. Will obtain anemia panel. His anemia is normochromic normocytic so it may just be secondary to chronic medical disease/failure to thrive - Anemia panel      Signed, [address] [place], [place], [facility] 05/24/2013 1:34 PM

## 2013-05-24 NOTE — Progress Notes (Signed)
Patient ID: Jeffrey [name], male   DOB: 25-Oct-1917, 79 y.o.   MRN: [id number]    CARDIOLOGY CONSULT NOTE  Patient ID: EZERIAH LUTY MRN: [id number] DOB/AGE: 02-29-1918 79 y.o.    HPI: Mr. Huot is a 79 yr old gentleman with known CAD s/p PCI of the LAD in the past (June 2003). He also has HTN, hyperlipidemia, and bilateral carotid artery stenosis. He had a presyncopal episode earlier in the summer and is thus referred today. His BP is 88/48 mmHg, and he is taking metoprolol 25 mg daily. He recently underwent an echocardiogram (July 2014) which revealed the following:  - Left ventricle: The cavity size was normal. Wall thickness was increased in a pattern of mild LVH. Systolic function was normal. The estimated ejection fraction was in the range of 60% to 65%. Doppler parameters are consistent with abnormal left ventricular relaxation (grade 1 diastolic dysfunction). - Mitral valve: Anterior leaflet is thickened with myxomatous appearance There is mild prolapse. MR is eccentric directed posterior into LA It is at least moderate in severity. - Pulmonary arteries: PA peak pressure: 31mm Hg (S).  He is here with his son-in-law. The patient lives with his girlfriend in [place], and his son-in-law lives in [place].  The patient says he experiences chest pain once a month at the most. He denies shortness of breath and leg swelling. He says that if he stands up too quickly sometimes, he gets dizzy, but denies syncope.  Earlier at an office appointment today, his BP was 162/94 mmHg with a pulse of 64 bpm.  He decided to stop taking Pletal on his own.     No Known Allergies  Current Outpatient Prescriptions  Medication Sig Dispense Refill  . cilostazol (PLETAL) 50 MG tablet Take 50 mg by mouth 2 (two) times daily.      Marland Kitchen Dextromethorphan-Guaifenesin (TUSSIN DM PO) Take by mouth.      . metoprolol succinate (TOPROL-XL) 25 MG 24 hr tablet Take 25 mg by mouth daily.      .  pravastatin (PRAVACHOL) 40 MG tablet Take 40 mg by mouth at bedtime.       No current facility-administered medications for this visit.    Past Medical History  Diagnosis Date  . Heart disease   . MI, old   . Mitral valvular regurgitation 05/24/2013    No past surgical history on file.  History   Social History  . Marital Status: Widowed    Spouse Name: N/A    Number of Children: N/A  . Years of Education: N/A   Occupational History  . Not on file.   Social History Main Topics  . Smoking status: Current Some Day Smoker  . Smokeless tobacco: Not on file  . Alcohol Use: Yes  . Drug Use: No  . Sexual Activity:    Other Topics Concern  . Not on file   Social History Narrative  . No narrative on file      Review of systems complete and found to be negative unless listed above in HPI     Physical exam Blood pressure 88/48, pulse 84, height 6' (1.829 m), weight 126 lb (57.153 kg). General: NAD Neck: No JVD, no thyromegaly or thyroid nodule.  Lungs: Clear to auscultation bilaterally with normal respiratory effort. Cardiac murmur transmitted to posterior field of left lung. CV: Nondisplaced PMI.  Heart regular S1/markedly diminished S2, no S3/S4, IV/VI apical harsh holosystolic murmur with radiation to left axilla and posterior left lung field.  No peripheral edema.  No carotid bruit.  Normal pedal pulses.  Abdomen: Soft, nontender, no hepatosplenomegaly, no distention.  Skin: Intact without lesions or rashes.  Neurologic: Alert and oriented x 3.  Psych: Normal affect. Extremities: No clubbing or cyanosis.  HEENT: Normal.   Labs:   Lab Results  Component Value Date   WBC 5.4 03/15/2013   HGB 10.4* 03/15/2013   HCT 30.6* 03/15/2013   MCV 85.2 03/15/2013   PLT 163 03/15/2013   No results found for this basename: NA, K, CL, CO2, BUN, CREATININE, CALCIUM, LABALBU, PROT, BILITOT, ALKPHOS, ALT, AST, GLUCOSE,  in the last 168 hours Lab Results  Component Value Date    CKTOTAL 71 09/17/2008   CKMB 1.9 09/17/2008   TROPONINI <0.30 10/06/2012    Lab Results  Component Value Date   CHOL 140 01/16/2009   CHOL  Value: 194        ATP III CLASSIFICATION:  <200     mg/dL   Desirable  161-096  mg/dL   Borderline High  >=045    mg/dL   High        12/06/8117   CHOL 157 01/13/2008   Lab Results  Component Value Date   HDL 44 01/16/2009   HDL 36* 09/17/2008   HDL 47 01/13/2008   Lab Results  Component Value Date   LDLCALC 79 01/16/2009   LDLCALC  Value: 144        Total Cholesterol/HDL:CHD Risk Coronary Heart Disease Risk Table                     Men   Women  1/2 Average Risk   3.4   3.3  Average Risk       5.0   4.4  2 X Average Risk   9.6   7.1  3 X Average Risk  23.4   11.0        Use the calculated Patient Ratio above and the CHD Risk Table to determine the patient's CHD Risk.        ATP III CLASSIFICATION (LDL):  <100     mg/dL   Optimal  147-829  mg/dL   Near or Above                    Optimal  130-159  mg/dL   Borderline  562-130  mg/dL   High  >865     mg/dL   Very High* 7/84/6962   LDLCALC 92 01/13/2008   Lab Results  Component Value Date   TRIG 87 01/16/2009   TRIG 72 09/17/2008   TRIG 90 01/13/2008   Lab Results  Component Value Date   CHOLHDL 3.2 Ratio 01/16/2009   CHOLHDL 5.4 09/17/2008   CHOLHDL 3.3 Ratio 01/13/2008   No results found for this basename: LDLDIRECT      ECG: sinus rhythm, RBBB, one PVC, LVH with repolarization changes  Studies: Cardiac Catheterization  PROCEDURES PERFORMED:  1. Percutaneous transluminal ______ angioplasty and balloon angioplasty of  the mid to distal left anterior descending artery and proximal left  anterior descending artery.  2. Percutaneous transluminal coronary angioplasty and stenting of the mid to  distal left anterior descending artery with a 2.5 x 13-mm Cypher stent  deployed at 16 atmospheres of pressure.  3. Intravascular ultrasound interrogation of the left anterior descending  artery.  4.  Intracoronary administration of nitroglycerin and Verapamil.  INDICATIONS: The patient is an 79 year old gentleman with a history  of  hyperlipidemia who underwent Cardiolite stress test which showed anterolateral  myocardial ischemia for which he underwent diagnostic cardiac catheterization  by Dr. Kem Boroughs on February 16, 2002. He was referred to me for evaluation  for PTCA of the left anterior descending artery.  IMPRESSION: Please see report by Dr. Kem Boroughs on February 16, 2002,  regarding diagnostic cardiac catheterization.  INTERVENTIONAL DATA:  1. A #8 French Q3.5 guide utilized.  2. Successful PTRA with a 1.5-mm Rotablator bur of the mid to distal left  anterior descending artery. The stenosis was reduced from 95% to about  50-60%.  3. Successful PTRA of the proximal to mid left anterior descending artery with  a 1.75-mm bur.  4. Successful balloon angioplasty of the proximal to mid left anterior  descending artery with a 2.5 x 30-mm Maverick balloon performed at 3  atmospheres of pressure.  5. PTCA with a 2.5 x 30-mm balloon in the mid to distal left anterior  descending artery performed at 3 atmospheres of pressure. The stenosis  was reduced from 95% to still persistent 50-60% stenosis.  6. Successful PTCA and stenting of distal left anterior descending artery  with a 2.5 x 13-mm Cypher stent deployed at 16 atmospheres of pressure for  1 minute. The total stenosis was reduced from 95% to 0% with TIMI-3 to  TIMI-3 flow with no evidence of dissection, no evidence of thrombus at the  end of the procedure. The proximal left anterior descending artery  stenosis was reduced from 60-70% to less than 30%.     ASSESSMENT AND PLAN:  1. CAD s/p PCI of LAD in 2003: he appears to be symptomatically stable. Given that he discontinued the Pletal, I will start ASA 81 mg daily. He is on pravastatin and metoprolol. 2. HTN: as stated previously, he was hypertensive at his earlier appt today,  and is hypotensive now. Given that he's had no syncopal episodes and no documented bradycardia, I will not change his current dose of metoprolol. He should move from the sitting to standing position slowly to avoid presyncope. 3. Mitral regurgitation: it was noted to be at least moderate in severity by TTE, and I suspect it may be closer to severe by auscultation. Medical management only. 4. Hyperlipidemia: continue pravastatin. Will refill today. 5. Presyncope: as noted above, with advice to move slowly from sitting to standing position.  Signed: Prentice Docker, M.D., F.A.C.C. 05/24/2013, 1:37 PM

## 2013-05-25 LAB — ANEMIA PANEL
%SAT: 22 % (ref 20–55)
Ferritin: 182 ng/mL (ref 22–322)
Folate: 12.3 ng/mL
Retic Ct Pct: 0.9 % (ref 0.4–2.3)
Vitamin B-12: 257 pg/mL (ref 211–911)

## 2013-06-04 ENCOUNTER — Encounter (HOSPITAL_COMMUNITY): Payer: Self-pay | Admitting: *Deleted

## 2013-06-04 ENCOUNTER — Emergency Department (HOSPITAL_COMMUNITY): Payer: Medicare Other

## 2013-06-04 ENCOUNTER — Inpatient Hospital Stay (HOSPITAL_COMMUNITY)
Admission: EM | Admit: 2013-06-04 | Discharge: 2013-06-08 | DRG: 280 | Disposition: A | Discharge status: Home Health | Payer: Medicare Other | Attending: Cardiology | Admitting: Cardiology

## 2013-06-04 DIAGNOSIS — R55 Syncope and collapse: Secondary | ICD-10-CM

## 2013-06-04 DIAGNOSIS — I951 Orthostatic hypotension: Secondary | ICD-10-CM

## 2013-06-04 DIAGNOSIS — I4891 Unspecified atrial fibrillation: Secondary | ICD-10-CM | POA: Diagnosis present

## 2013-06-04 DIAGNOSIS — J4489 Other specified chronic obstructive pulmonary disease: Secondary | ICD-10-CM | POA: Diagnosis present

## 2013-06-04 DIAGNOSIS — E43 Unspecified severe protein-calorie malnutrition: Secondary | ICD-10-CM | POA: Insufficient documentation

## 2013-06-04 DIAGNOSIS — J449 Chronic obstructive pulmonary disease, unspecified: Secondary | ICD-10-CM | POA: Diagnosis present

## 2013-06-04 DIAGNOSIS — I472 Ventricular tachycardia, unspecified: Secondary | ICD-10-CM | POA: Diagnosis not present

## 2013-06-04 DIAGNOSIS — N390 Urinary tract infection, site not specified: Secondary | ICD-10-CM

## 2013-06-04 DIAGNOSIS — F17200 Nicotine dependence, unspecified, uncomplicated: Secondary | ICD-10-CM | POA: Diagnosis present

## 2013-06-04 DIAGNOSIS — H0530 Unspecified deformity of orbit: Secondary | ICD-10-CM

## 2013-06-04 DIAGNOSIS — I4729 Other ventricular tachycardia: Secondary | ICD-10-CM | POA: Diagnosis not present

## 2013-06-04 DIAGNOSIS — I214 Non-ST elevation (NSTEMI) myocardial infarction: Principal | ICD-10-CM

## 2013-06-04 DIAGNOSIS — W19XXXA Unspecified fall, initial encounter: Secondary | ICD-10-CM | POA: Diagnosis present

## 2013-06-04 DIAGNOSIS — I739 Peripheral vascular disease, unspecified: Secondary | ICD-10-CM | POA: Diagnosis present

## 2013-06-04 DIAGNOSIS — I340 Nonrheumatic mitral (valve) insufficiency: Secondary | ICD-10-CM | POA: Diagnosis present

## 2013-06-04 DIAGNOSIS — E860 Dehydration: Secondary | ICD-10-CM | POA: Diagnosis present

## 2013-06-04 DIAGNOSIS — E785 Hyperlipidemia, unspecified: Secondary | ICD-10-CM | POA: Diagnosis present

## 2013-06-04 DIAGNOSIS — J329 Chronic sinusitis, unspecified: Secondary | ICD-10-CM | POA: Diagnosis present

## 2013-06-04 DIAGNOSIS — I2510 Atherosclerotic heart disease of native coronary artery without angina pectoris: Secondary | ICD-10-CM | POA: Diagnosis present

## 2013-06-04 DIAGNOSIS — I252 Old myocardial infarction: Secondary | ICD-10-CM

## 2013-06-04 DIAGNOSIS — I10 Essential (primary) hypertension: Secondary | ICD-10-CM | POA: Diagnosis present

## 2013-06-04 DIAGNOSIS — D649 Anemia, unspecified: Secondary | ICD-10-CM

## 2013-06-04 DIAGNOSIS — Z9861 Coronary angioplasty status: Secondary | ICD-10-CM

## 2013-06-04 DIAGNOSIS — I059 Rheumatic mitral valve disease, unspecified: Secondary | ICD-10-CM | POA: Diagnosis present

## 2013-06-04 HISTORY — DX: Hyperlipidemia, unspecified: E78.5

## 2013-06-04 HISTORY — DX: Essential (primary) hypertension: I10

## 2013-06-04 HISTORY — DX: Peripheral vascular disease, unspecified: I73.9

## 2013-06-04 HISTORY — DX: Orthostatic hypotension: I95.1

## 2013-06-04 HISTORY — DX: Atherosclerotic heart disease of native coronary artery without angina pectoris: I25.10

## 2013-06-04 HISTORY — DX: Anemia, unspecified: D64.9

## 2013-06-04 HISTORY — DX: Paroxysmal atrial fibrillation: I48.0

## 2013-06-04 HISTORY — DX: Syncope and collapse: R55

## 2013-06-04 HISTORY — DX: Unspecified atrial fibrillation: I48.91

## 2013-06-04 HISTORY — DX: Other ventricular tachycardia: I47.29

## 2013-06-04 HISTORY — DX: Ventricular tachycardia: I47.2

## 2013-06-04 HISTORY — DX: Chronic obstructive pulmonary disease, unspecified: J44.9

## 2013-06-04 HISTORY — DX: Unspecified severe protein-calorie malnutrition: E43

## 2013-06-04 LAB — URINALYSIS, ROUTINE W REFLEX MICROSCOPIC
Bilirubin Urine: NEGATIVE
Glucose, UA: NEGATIVE mg/dL
Ketones, ur: NEGATIVE mg/dL
Nitrite: POSITIVE — AB
Specific Gravity, Urine: 1.02 (ref 1.005–1.030)
pH: 6 (ref 5.0–8.0)

## 2013-06-04 LAB — COMPREHENSIVE METABOLIC PANEL
ALT: <5 U/L (ref 0–53)
AST: 14 U/L (ref 0–37)
Albumin: 3.3 g/dL — ABNORMAL LOW (ref 3.5–5.2)
Alkaline Phosphatase: 55 U/L (ref 39–117)
Chloride: 106 mEq/L (ref 96–112)
Creatinine, Ser: 1.05 mg/dL (ref 0.50–1.35)
Potassium: 3.9 mEq/L (ref 3.5–5.1)
Sodium: 142 mEq/L (ref 135–145)
Total Bilirubin: 0.3 mg/dL (ref 0.3–1.2)
Total Protein: 7.2 g/dL (ref 6.0–8.3)

## 2013-06-04 LAB — CBC WITH DIFFERENTIAL/PLATELET
Basophils Relative: 0 % (ref 0–1)
Eosinophils Absolute: 0 10*3/uL (ref 0.0–0.7)
Eosinophils Relative: 0 % (ref 0–5)
HCT: 29 % — ABNORMAL LOW (ref 39.0–52.0)
Hemoglobin: 9.6 g/dL — ABNORMAL LOW (ref 13.0–17.0)
MCH: 29.6 pg (ref 26.0–34.0)
MCHC: 33.1 g/dL (ref 30.0–36.0)
MCV: 89.5 fL (ref 78.0–100.0)
Monocytes Relative: 6 % (ref 3–12)
Neutro Abs: 8.1 10*3/uL — ABNORMAL HIGH (ref 1.7–7.7)
Neutrophils Relative %: 81 % — ABNORMAL HIGH (ref 43–77)
Platelets: 207 10*3/uL (ref 150–400)
RBC: 3.24 MIL/uL — ABNORMAL LOW (ref 4.22–5.81)

## 2013-06-04 LAB — URINE MICROSCOPIC-ADD ON

## 2013-06-04 MED ORDER — SODIUM CHLORIDE 0.9 % IV BOLUS (SEPSIS)
500.0000 mL | Freq: Once | INTRAVENOUS | Status: AC
  Administered 2013-06-04: 500 mL via INTRAVENOUS

## 2013-06-04 MED ORDER — DEXTROSE 5 % IV SOLN
1.0000 g | Freq: Once | INTRAVENOUS | Status: AC
  Administered 2013-06-04: 1 g via INTRAVENOUS
  Filled 2013-06-04: qty 10

## 2013-06-04 MED ORDER — ACETAMINOPHEN 325 MG PO TABS
650.0000 mg | ORAL_TABLET | Freq: Once | ORAL | Status: AC
  Administered 2013-06-04: 650 mg via ORAL
  Filled 2013-06-04: qty 2

## 2013-06-04 NOTE — ED Notes (Signed)
[place] the bank, "[place]ed out", fell on lt side, co lt upper leg pain, no deformities, pt states he has not eaten today. Per EMS CBG 125, positive orthostatic, receiving fluids.

## 2013-06-04 NOTE — ED Notes (Signed)
Pt become somewhat dizzy when he sat up during orthostatic VS.  EDP aware of pt condition.

## 2013-06-04 NOTE — ED Notes (Signed)
Family given a Coke at this time.

## 2013-06-04 NOTE — ED Provider Notes (Addendum)
CSN: [id number]     Arrival date & time 06/04/13  1547 History   First MD Initiated Contact with Patient 06/04/13 1553     Chief Complaint  Patient presents with  . Loss of Consciousness  . Fall    lt leg pain   (Consider location/radiation/quality/duration/timing/severity/associated sxs/prior Treatment) HPI Comments: Patient brought to the ER by EMS for syncopal episode. Patient was standing in line at the bank when he suddenly became weak and dizzy and fell, landing on his left side. He reports that his left hip hurts since the fall, but he did stand up and walk since the episode. Patient reports that he has not eaten today and has not drank much. EMS bring him in stating that he was significantly orthostatic for them.  At arrival patient denies headache. There is no neck or back pain. Denies chest pain, palpitations and shortness of breath.  Patient is a 79 y.o. male presenting with syncope and fall.  Loss of Consciousness Associated symptoms: dizziness   Associated symptoms: no chest pain, no headaches and no shortness of breath   Fall Pertinent negatives include no chest pain, no headaches and no shortness of breath.    Past Medical History  Diagnosis Date  . Heart disease   . MI, old   . Mitral valvular regurgitation 05/24/2013  . Atrial fibrillation   . Hypertension    History reviewed. No pertinent past surgical history. History reviewed. No pertinent family history. History  Substance Use Topics  . Smoking status: Current Some Day Smoker  . Smokeless tobacco: Not on file  . Alcohol Use: Yes    Review of Systems  Unable to perform ROS Respiratory: Negative for shortness of breath.   Cardiovascular: Positive for syncope. Negative for chest pain.  Neurological: Positive for dizziness and syncope. Negative for headaches.    Allergies  Review of patient's allergies indicates no known allergies.  Home Medications   Current Outpatient Rx  Name  Route  Sig   Dispense  Refill  . aspirin 81 MG tablet   Oral   Take 81 mg by mouth daily.         Marland Kitchen Dextromethorphan-Guaifenesin (TUSSIN DM PO)   Oral   Take by mouth.         . metoprolol succinate (TOPROL-XL) 25 MG 24 hr tablet   Oral   Take 25 mg by mouth daily.         . pravastatin (PRAVACHOL) 40 MG tablet   Oral   Take 1 tablet (40 mg total) by mouth at bedtime.   30 tablet   6    BP 160/84  Pulse 84  Temp(Src) 98.3 F (36.8 C) (Oral)  Resp 19  Ht 5\' 8"  (1.727 m)  Wt 130 lb (58.968 kg)  BMI 19.77 kg/m2  SpO2 95% Physical Exam  Constitutional: He is oriented to person, place, and time. He appears well-developed and well-nourished. No distress.  HENT:  Head: Normocephalic and atraumatic.  Right Ear: Hearing normal.  Left Ear: Hearing normal.  Nose: Nose normal.  Mouth/Throat: Oropharynx is clear and moist and mucous membranes are normal.  Eyes: Conjunctivae and EOM are normal. Pupils are equal, round, and reactive to light.  Neck: Normal range of motion. Neck supple.  Cardiovascular: Regular rhythm, S1 normal and S2 normal.  Exam reveals no gallop and no friction rub.   No murmur heard. Pulmonary/Chest: Effort normal and breath sounds normal. No respiratory distress. He exhibits no tenderness.  Abdominal: Soft.  Normal appearance and bowel sounds are normal. There is no hepatosplenomegaly. There is no tenderness. There is no rebound, no guarding, no tenderness at McBurney's point and negative Murphy's sign. No hernia.  Musculoskeletal: Normal range of motion.       Left hip: He exhibits normal range of motion, normal strength, no bony tenderness and no deformity.  Neurological: He is alert and oriented to person, place, and time. He has normal strength. No cranial nerve deficit or sensory deficit. Coordination normal. GCS eye subscore is 4. GCS verbal subscore is 5. GCS motor subscore is 6.  Skin: Skin is warm, dry and intact. No rash noted. No cyanosis.  Psychiatric: He  has a normal mood and affect. His speech is normal and behavior is normal. Thought content normal.    ED Course  Procedures (including critical care time)  EKG:  Date: 06/04/2013  Rate: 104  Rhythm: sinus tachycardia  QRS Axis: normal  Intervals: normal  ST/T Wave abnormalities: ST depressions inferiorly and ST depressions laterally  Conduction Disutrbances:right bundle branch block  Narrative Interpretation:   Old EKG Reviewed: changes noted and ST depressions new    Labs Review Labs Reviewed  CBC WITH DIFFERENTIAL - Abnormal; Notable for the following:    RBC 3.24 (*)    Hemoglobin 9.6 (*)    HCT 29.0 (*)    Neutrophils Relative % 81 (*)    Neutro Abs 8.1 (*)    All other components within normal limits  COMPREHENSIVE METABOLIC PANEL - Abnormal; Notable for the following:    Glucose, Bld 104 (*)    Albumin 3.3 (*)    GFR calc non Af Amer 59 (*)    GFR calc Af Amer 69 (*)    All other components within normal limits  URINALYSIS, ROUTINE W REFLEX MICROSCOPIC - Abnormal; Notable for the following:    APPearance CLOUDY (*)    Hgb urine dipstick SMALL (*)    Nitrite POSITIVE (*)    Leukocytes, UA SMALL (*)    All other components within normal limits  URINE MICROSCOPIC-ADD ON - Abnormal; Notable for the following:    Bacteria, UA MANY (*)    All other components within normal limits  URINE CULTURE  CULTURE, BLOOD (ROUTINE X 2)  CULTURE, BLOOD (ROUTINE X 2)  TROPONIN I  LACTIC ACID, PLASMA   Imaging Review Dg Chest 1 View  06/04/2013   CLINICAL DATA:  Left hip pain, post fall, cough, hypertension, smoker, MRI  EXAM: CHEST - 1 VIEW  COMPARISON:  10/06/2012  FINDINGS: Enlargement of cardiac silhouette.  Calcified mild tortuous thoracic aorta.  Pulmonary vascularity normal.  Lungs appear emphysematous but clear.  No pleural effusion or pneumothorax.  Osseous structures unremarkable.  IMPRESSION: Minimal enlargement of cardiac silhouette.  COPD changes.  No acute  abnormalities.   Electronically Signed   By: Ulyses Southward M.D.   On: 06/04/2013 17:08   Dg Hip Complete Left  06/04/2013   CLINICAL DATA:  Left hip pain post fall  EXAM: LEFT HIP - COMPLETE 2+ VIEW  COMPARISON:  None  FINDINGS: Osseous demineralization.  Mild narrowing of the hip joints bilaterally.  No acute fracture, dislocation, or bone destruction.  Numerous pelvic phleboliths.  IMPRESSION: No acute osseous abnormalities.   Electronically Signed   By: Ulyses Southward M.D.   On: 06/04/2013 17:08   Ct Head Wo Contrast  06/04/2013   *RADIOLOGY REPORT*  Clinical Data:  Initial evaluation for this 79 year old who had a syncopal episode  earlier today and fell.  The patient is amnestic to the event.  Current complaint of headache.  CT HEAD WITHOUT CONTRAST CT CERVICAL SPINE WITHOUT CONTRAST  Technique:  Multidetector CT imaging of the head and cervical spine was performed following the standard protocol without intravenous contrast.  Multiplanar CT image reconstructions of the cervical spine were also generated.  Comparison:  None.  CT HEAD  Findings: Moderate to severe cortical and deep atrophy, moderate cerebellar atrophy, and severe changes of small vessel disease of the white matter diffusely.  No mass lesion.  No midline shift.  No acute hemorrhage or hematoma.  No extra-axial fluid collections. No evidence of acute infarction.  Air-fluid level in the left maxillary sinus with possible fracture involving the floor of the left orbit.  No fractures elsewhere involving the skull or the visualized facial bones. Remaining visualized paranasal sinuses, bilateral mastoid air cells, and bilateral middle ear cavities well-aerated.  Lipoma involving the subcutaneous tissues of the left frontal region with a visible protrusion.  Extensive bilateral carotid siphon and vertebral artery atherosclerosis.  IMPRESSION:  1.  No acute intracranial abnormality. 2.  Moderate to severe generalized atrophy and severe chronic  microvascular ischemic changes of the white matter. 3.  Possible left orbital floor fracture.  Air-fluid level in the left maxillary sinus. 4.  Lipoma involving the subcutaneous tissues of the left frontal region.  CT CERVICAL SPINE  Findings: No fractures identified involving the cervical spine. Sagittal reconstructed images demonstrate disc space narrowing and associated endplate hypertrophic changes at every cervical level. Mild spinal stenosis at the C3-4 level and moderate spinal stenosis at the C5-6 level.  Degenerative changes at the C1-C2 articulation. Facet joints intact with severe degenerative changes throughout; degenerative changes account for ankylosis of the left C3-4 facet joint.  Coronal reformatted images demonstrate intact craniocervical junction, intact dens, and intact lateral masses. Facet and uncinate hypertrophy account for multilevel foraminal stenoses including severe left and moderate right C2-3, severe bilateral C3-4, severe right and moderate left C4-5, severe bilateral C5-6, severe bilateral C6-7, and mild to moderate right C7-T1.  IMPRESSION:  1.  No cervical spine fractures identified. 2.  Multilevel degenerative disc disease, spondylosis, and facet degenerative changes as detailed above.   Original Report Authenticated By: Hulan Saas, M.D.   Ct Cervical Spine Wo Contrast  06/04/2013   *RADIOLOGY REPORT*  Clinical Data:  Initial evaluation for this 79 year old who had a syncopal episode earlier today and fell.  The patient is amnestic to the event.  Current complaint of headache.  CT HEAD WITHOUT CONTRAST CT CERVICAL SPINE WITHOUT CONTRAST  Technique:  Multidetector CT imaging of the head and cervical spine was performed following the standard protocol without intravenous contrast.  Multiplanar CT image reconstructions of the cervical spine were also generated.  Comparison:  None.  CT HEAD  Findings: Moderate to severe cortical and deep atrophy, moderate cerebellar atrophy,  and severe changes of small vessel disease of the white matter diffusely.  No mass lesion.  No midline shift.  No acute hemorrhage or hematoma.  No extra-axial fluid collections. No evidence of acute infarction.  Air-fluid level in the left maxillary sinus with possible fracture involving the floor of the left orbit.  No fractures elsewhere involving the skull or the visualized facial bones. Remaining visualized paranasal sinuses, bilateral mastoid air cells, and bilateral middle ear cavities well-aerated.  Lipoma involving the subcutaneous tissues of the left frontal region with a visible protrusion.  Extensive bilateral carotid siphon and vertebral  artery atherosclerosis.  IMPRESSION:  1.  No acute intracranial abnormality. 2.  Moderate to severe generalized atrophy and severe chronic microvascular ischemic changes of the white matter. 3.  Possible left orbital floor fracture.  Air-fluid level in the left maxillary sinus. 4.  Lipoma involving the subcutaneous tissues of the left frontal region.  CT CERVICAL SPINE  Findings: No fractures identified involving the cervical spine. Sagittal reconstructed images demonstrate disc space narrowing and associated endplate hypertrophic changes at every cervical level. Mild spinal stenosis at the C3-4 level and moderate spinal stenosis at the C5-6 level.  Degenerative changes at the C1-C2 articulation. Facet joints intact with severe degenerative changes throughout; degenerative changes account for ankylosis of the left C3-4 facet joint.  Coronal reformatted images demonstrate intact craniocervical junction, intact dens, and intact lateral masses. Facet and uncinate hypertrophy account for multilevel foraminal stenoses including severe left and moderate right C2-3, severe bilateral C3-4, severe right and moderate left C4-5, severe bilateral C5-6, severe bilateral C6-7, and mild to moderate right C7-T1.  IMPRESSION:  1.  No cervical spine fractures identified. 2.  Multilevel  degenerative disc disease, spondylosis, and facet degenerative changes as detailed above.   Original Report Authenticated By: Hulan Saas, M.D.    MDM  Diagnosis: 1. Urinary tract infection 2. Syncope 3. Hypertension with orthostasis 4. Dehydration  Patient presents to the ER after syncopal episode. Patient reports a past standing in line at the bank. Patient complained of mild left hip pain, but was ambulatory after the fall. X-ray of the left hip joint injury. CT head shows no evidence of intracranial injury. There is air-fluid level in the left maxillary sinus and radiologist raises suspicion for left orbital floor fracture. Patient's pain is on the right side, there is no sign of swelling, tenderness or trauma around the left eye, acute fracture is felt unlikely.  CT cervical spine was performed because of a fall in no acute fracture is seen. Chest x-ray is showing evidence of injury or pneumonia.  Patient is well-appearing the ER. Was given a fluid bolus, blood pressure is normal while lying in bed. After bolus by EMS in a bolus here in the ER, however, the patient is profoundly orthostatic and therefore will require hospitalization. I do not feel that the patient has any evidence of sepsis currently (normal lactate). Patient had blood cultures, urine cultures sent and was started on Rocephin empirically for urinary tract infection.  Case was discussed with Doctor Terressa Koyanagi, on call for cardiology at [facility] Specifically I asked him to look at the EKG tracings from the current presentation. I was concerned about the deep ST depressions in inferior and lateral leads with elevations in aVR and V1. These are not contiguous leads, but were concerning to me for the possible pattern of anterior injury. He did confirm that there was no need to the patient has ST elevation MI, in addition to ischemia, the depressions could be secondary to a right bundle branch block. Recommended admission here in  treatment of the underlying process causing the syncope, specifically UTI.  Gilda Crease, MD 06/04/13 2006  Gilda Crease, MD 06/04/13 2150

## 2013-06-05 DIAGNOSIS — N390 Urinary tract infection, site not specified: Secondary | ICD-10-CM

## 2013-06-05 DIAGNOSIS — E43 Unspecified severe protein-calorie malnutrition: Secondary | ICD-10-CM | POA: Insufficient documentation

## 2013-06-05 DIAGNOSIS — R55 Syncope and collapse: Secondary | ICD-10-CM

## 2013-06-05 DIAGNOSIS — I951 Orthostatic hypotension: Secondary | ICD-10-CM

## 2013-06-05 DIAGNOSIS — D649 Anemia, unspecified: Secondary | ICD-10-CM

## 2013-06-05 DIAGNOSIS — I214 Non-ST elevation (NSTEMI) myocardial infarction: Secondary | ICD-10-CM

## 2013-06-05 LAB — COMPREHENSIVE METABOLIC PANEL
AST: 58 U/L — ABNORMAL HIGH (ref 0–37)
Albumin: 2.7 g/dL — ABNORMAL LOW (ref 3.5–5.2)
Calcium: 8.4 mg/dL (ref 8.4–10.5)
Chloride: 107 mEq/L (ref 96–112)
Creatinine, Ser: 0.93 mg/dL (ref 0.50–1.35)
GFR calc Af Amer: 82 mL/min — ABNORMAL LOW (ref >90)
Potassium: 3.5 mEq/L (ref 3.5–5.1)
Sodium: 143 mEq/L (ref 135–145)
Total Protein: 6.2 g/dL (ref 6.0–8.3)

## 2013-06-05 LAB — CBC
HCT: 25.7 % — ABNORMAL LOW (ref 39.0–52.0)
MCH: 29.7 pg (ref 26.0–34.0)
MCHC: 33.1 g/dL (ref 30.0–36.0)
MCV: 89.9 fL (ref 78.0–100.0)
Platelets: 173 10*3/uL (ref 150–400)
RDW: 13.6 % (ref 11.5–15.5)
WBC: 12 10*3/uL — ABNORMAL HIGH (ref 4.0–10.5)

## 2013-06-05 LAB — HEMOGLOBIN A1C: Hgb A1c MFr Bld: 6.3 % — ABNORMAL HIGH (ref <5.7)

## 2013-06-05 LAB — MRSA PCR SCREENING: MRSA by PCR: NEGATIVE

## 2013-06-05 LAB — TSH: TSH: 0.787 u[IU]/mL (ref 0.350–4.500)

## 2013-06-05 LAB — HEPARIN LEVEL (UNFRACTIONATED): Heparin Unfractionated: <0.1 IU/mL — ABNORMAL LOW (ref 0.30–0.70)

## 2013-06-05 LAB — TROPONIN I: Troponin I: >20 ng/mL (ref <0.30)

## 2013-06-05 LAB — MAGNESIUM: Magnesium: 1.9 mg/dL (ref 1.5–2.5)

## 2013-06-05 MED ORDER — ATORVASTATIN CALCIUM 40 MG PO TABS
80.0000 mg | ORAL_TABLET | Freq: Every day | ORAL | Status: AC
  Administered 2013-06-05: 80 mg via ORAL
  Filled 2013-06-05: qty 2

## 2013-06-05 MED ORDER — HEPARIN BOLUS VIA INFUSION
3000.0000 [IU] | Freq: Once | INTRAVENOUS | Status: AC
  Administered 2013-06-05: 3000 [IU] via INTRAVENOUS
  Filled 2013-06-05: qty 3000

## 2013-06-05 MED ORDER — ASPIRIN EC 81 MG PO TBEC: 81.0000 mg | DELAYED_RELEASE_TABLET | Freq: Every day | ORAL | Status: DC

## 2013-06-05 MED ORDER — METOPROLOL SUCCINATE ER 25 MG PO TB24: 25.0000 mg | ORAL_TABLET | Freq: Every morning | ORAL | Status: DC

## 2013-06-05 MED ORDER — HEPARIN (PORCINE) IN NACL 100-0.45 UNIT/ML-% IJ SOLN
700.0000 [IU]/h | INTRAMUSCULAR | Status: DC
  Administered 2013-06-05: 700 [IU]/h via INTRAVENOUS
  Filled 2013-06-05: qty 250

## 2013-06-05 MED ORDER — POTASSIUM CHLORIDE IN NACL 20-0.9 MEQ/L-% IV SOLN
INTRAVENOUS | Status: DC
  Administered 2013-06-05 (×2): via INTRAVENOUS
  Administered 2013-06-05: 1000 mL via INTRAVENOUS
  Administered 2013-06-06: 14:00:00 via INTRAVENOUS
  Filled 2013-06-05 (×3): qty 1000

## 2013-06-05 MED ORDER — FLEET ENEMA 7-19 GM/118ML RE ENEM
1.0000 | ENEMA | Freq: Once | RECTAL | Status: AC | PRN
  Filled 2013-06-05: qty 1

## 2013-06-05 MED ORDER — TRAZODONE 25 MG HALF TABLET
25.0000 mg | ORAL_TABLET | Freq: Every evening | ORAL | Status: DC | PRN
  Filled 2013-06-05: qty 1

## 2013-06-05 MED ORDER — HEPARIN (PORCINE) IN NACL 100-0.45 UNIT/ML-% IJ SOLN
950.0000 [IU]/h | INTRAMUSCULAR | Status: DC
  Administered 2013-06-05 – 2013-06-06 (×2): 950 [IU]/h via INTRAVENOUS
  Filled 2013-06-05 (×2): qty 250

## 2013-06-05 MED ORDER — HEPARIN SODIUM (PORCINE) 1000 UNIT/ML IJ SOLN
3000.0000 [IU] | Freq: Once | INTRAMUSCULAR | Status: DC
  Filled 2013-06-05: qty 3

## 2013-06-05 MED ORDER — ENOXAPARIN SODIUM 40 MG/0.4ML ~~LOC~~ SOLN: 40.0000 mg | SUBCUTANEOUS | Status: DC

## 2013-06-05 MED ORDER — SODIUM CHLORIDE 0.9 % IV SOLN: 250.0000 mL | INTRAVENOUS | Status: DC | PRN

## 2013-06-05 MED ORDER — SODIUM CHLORIDE 0.9 % IJ SOLN
3.0000 mL | Freq: Two times a day (BID) | INTRAMUSCULAR | Status: DC
  Administered 2013-06-05 – 2013-06-07 (×4): 3 mL via INTRAVENOUS

## 2013-06-05 MED ORDER — METOPROLOL SUCCINATE 12.5 MG HALF TABLET
12.5000 mg | ORAL_TABLET | Freq: Every morning | ORAL | Status: DC
  Administered 2013-06-05 – 2013-06-08 (×4): 12.5 mg via ORAL
  Filled 2013-06-05 (×4): qty 1

## 2013-06-05 MED ORDER — ACETAMINOPHEN 500 MG PO TABS: 500.0000 mg | ORAL_TABLET | Freq: Four times a day (QID) | ORAL | Status: DC | PRN

## 2013-06-05 MED ORDER — ASPIRIN EC 81 MG PO TBEC
81.0000 mg | DELAYED_RELEASE_TABLET | Freq: Every day | ORAL | Status: DC
  Administered 2013-06-05 – 2013-06-08 (×4): 81 mg via ORAL
  Filled 2013-06-05 (×4): qty 1

## 2013-06-05 MED ORDER — CLOPIDOGREL BISULFATE 75 MG PO TABS
600.0000 mg | ORAL_TABLET | Freq: Once | ORAL | Status: AC
  Administered 2013-06-05: 600 mg via ORAL
  Filled 2013-06-05 (×2): qty 8

## 2013-06-05 MED ORDER — DEXTROSE 5 % IV SOLN
1.0000 g | INTRAVENOUS | Status: DC
  Administered 2013-06-05 – 2013-06-07 (×3): 1 g via INTRAVENOUS
  Filled 2013-06-05 (×5): qty 10

## 2013-06-05 MED ORDER — ATORVASTATIN CALCIUM 80 MG PO TABS
80.0000 mg | ORAL_TABLET | Freq: Every day | ORAL | Status: DC
  Administered 2013-06-07: 80 mg via ORAL
  Filled 2013-06-05 (×3): qty 1

## 2013-06-05 MED ORDER — ENSURE COMPLETE PO LIQD
237.0000 mL | Freq: Two times a day (BID) | ORAL | Status: DC
  Administered 2013-06-05 – 2013-06-08 (×5): 237 mL via ORAL

## 2013-06-05 MED ORDER — CLOPIDOGREL BISULFATE 75 MG PO TABS
75.0000 mg | ORAL_TABLET | Freq: Every day | ORAL | Status: DC
  Administered 2013-06-06 – 2013-06-08 (×3): 75 mg via ORAL
  Filled 2013-06-05 (×4): qty 1

## 2013-06-05 MED ORDER — SODIUM CHLORIDE 0.9 % IJ SOLN: 3.0000 mL | INTRAMUSCULAR | Status: DC | PRN

## 2013-06-05 MED ORDER — SODIUM CHLORIDE 0.9 % IJ SOLN
3.0000 mL | Freq: Two times a day (BID) | INTRAMUSCULAR | Status: DC
  Administered 2013-06-05 – 2013-06-06 (×2): 3 mL via INTRAVENOUS

## 2013-06-05 MED ORDER — ONDANSETRON HCL 4 MG/2ML IJ SOLN: 4.0000 mg | INTRAMUSCULAR | Status: DC | PRN

## 2013-06-05 NOTE — Evaluation (Signed)
Physical Therapy Evaluation Patient Details Name: Jeffrey [name] MRN: [id number] DOB: 1918-04-11 Today's Date: 06/05/2013 Time: 4098-1191 PT Time Calculation (min): 35 min  PT Assessment / Plan / Recommendation History of Present Illness   [name] is a 79 yo male who lives alone. He does not use an assistive device to ambulate.  He states that his legs become weak and then he falls.  He has had two falls in the last three months.  Clinical Impression  Pt LE strength is generally weaker.  Pt has an unsteady gait with a cane which improves with a walker.  Therapist recommends that the pt ambulates with a rolling walker while he gets outpatient therapy to progress him to a point where he is once again safe ambulating without an assistive device.      PT Assessment  Patient needs continued PT services    Follow Up Recommendations  Outpatient PT    Does the patient have the potential to tolerate intense rehabilitation    N/A  Barriers to Discharge  none      Equipment Recommendations  Rolling walker with 5" wheels    Recommendations for Other Services   none  Frequency Min 3X/week    Precautions / Restrictions Precautions Precautions: Fall Restrictions Weight Bearing Restrictions: No   Pertinent Vitals/Pain Lt thigh is sore with exercises but not at rest.      Mobility  Bed Mobility Bed Mobility: Supine to Sit Supine to Sit: 7: Independent Transfers Transfers: Sit to Stand Sit to Stand: 6: Modified independent (Device/Increase time) Ambulation/Gait Ambulation/Gait Assistance: 5: Supervision Assistive device: Rolling walker (attempted no asssistive device pt was unsteady; cane unstead) Gait Pattern: Within Functional Limits Gait velocity: normal for age    Exercises General Exercises - Lower Extremity Heel Slides: Both;5 reps Hip ABduction/ADduction: Both;5 reps Straight Leg Raises: Both;5 reps   PT Diagnosis: Generalized weakness  PT Problem List:  Decreased strength;Decreased activity tolerance;Decreased balance;Decreased knowledge of use of DME PT Treatment Interventions: Gait training;Balance training;Therapeutic exercise     PT Goals(Current goals can be found in the care plan section) Acute Rehab PT Goals PT Goal Formulation: With patient Time For Goal Achievement: 06/07/13 Potential to Achieve Goals: Good  Visit Information  Last PT Received On: 06/05/13       Prior Functioning  Home Living Family/patient expects to be discharged to:: Private residence Living Arrangements: Alone Available Help at Discharge: Friend(s) Type of Home: Apartment Home Access: Level entry Home Layout: One level Home Equipment: None Prior Function Level of Independence: Independent Communication Communication: No difficulties Dominant Hand: Right    Cognition  Cognition Arousal/Alertness: Awake/alert Overall Cognitive Status: Within Functional Limits for tasks assessed    Extremity/Trunk Assessment Lower Extremity Assessment Lower Extremity Assessment: Generalized weakness   Balance Balance Balance Assessed: No  End of Session PT - End of Session Equipment Utilized During Treatment: Gait belt Patient left: in chair;with call bell/phone within reach;with family/visitor present  GP     Nishan Ovens,CINDY 06/05/2013, 12:23 PM

## 2013-06-05 NOTE — Progress Notes (Signed)
Patient 's Troponin 18.34,Dr Irene Limbo aware.Will continue to monitor patient.

## 2013-06-05 NOTE — Progress Notes (Addendum)
ANTICOAGULATION CONSULT NOTE -  Pharmacy Consult for Heparin Indication: chest pain/ACS - NSTEMI  No Known Allergies  Patient Measurements: Height: 5\' 8"  (172.7 cm) Weight: 130 lb (58.968 kg) IBW/kg (Calculated) : 68.4  Vital Signs: Temp: 98.1 F (36.7 C) (10/07 1540) Temp src: Oral (10/07 0554) BP: 125/64 mmHg (10/07 1540) Pulse Rate: 67 (10/07 1540)  Labs:  Recent Labs  06/04/13 1640 06/05/13 0526 06/05/13 0658 06/05/13 1144 06/05/13 1521  HGB 9.6* 8.5*  --   --   --   HCT 29.0* 25.7*  --   --   --   PLT 207 173  --   --   --   HEPARINUNFRC  --   --   --   --  <0.10*  CREATININE 1.05 0.93  --   --   --   TROPONINI <0.30  --  18.34* >20.00*  --     Estimated Creatinine Clearance: 42.3 ml/min (by C-G formula based on Cr of 0.93).  Medical History: Past Medical History  Diagnosis Date  . Heart disease   . MI, old   . Mitral valvular regurgitation 05/24/2013  . Atrial fibrillation   . Hypertension     Medications:  Infusions:  . 0.9 % NaCl with KCl 20 mEq / L 75 mL/hr at 06/05/13 1637  . heparin      Assessment: 79yo male with syncopal episode.  In ED pt was noted to have markedly abnormal EKG and cardiology recommended admission.  Troponin now markedly elevated.  Initiating IV Heparin. Heparin below goal  Goal of Therapy:  Heparin level 0.3-0.7 units/ml Monitor platelets by anticoagulation protocol: Yes   Plan:  Heparin 3000 unit bolus then Increase heparin infusion to 950 units/hour  Check heparin level in 6-8 hrs then daily CBC daily  Raquel James, Keyarah Mcroy Bennett 06/05/2013,4:58 PM

## 2013-06-05 NOTE — Progress Notes (Addendum)
[facility] PROGRESS NOTE  Jeffrey [name] JYN:[id number] DOB: 11-Jul-1918 DOA: 06/04/2013 PCP: [name], PA-C  Addendum 1530: Patient has elected further evaluation. Dr. Wyline Mood plans transfer to [name] at [name] today.  Addendum 1300 Discussed with Dr. Larwance Sachs has evaluated the patient, he has recommended transfer to [facility] cone, patient is considering. Currently he is asymptomatic.  Assessment/Plan: 1. Non-ST elevation MI: asymptomatic. Vital stable. Treatment and plan as below. 2. Syncope: Suspect secondary to myocardial infarction. Orthostasis also seen. IV fluids. Followup echocardiogram. 3. UTI with fever: Followup culture. Continue antibiotics. 4. Chronic normocytic anemia: Etiology unclear, possible acute component. No evidence of bleeding. Suspect dilutional 5. History coronary artery disease 6. Left maxillary sinus disease, abnormal appearance of the orbit: Discussed CT findings with Dr. [name]. Abnormal appearance of the orbital floor likely related to sinusitis. In the absence of clinical findings or symptoms no further evaluation is recommended. Her last patient is asymptomatic with a normal exam. Already on antibiotics for UTI. 7. Severe malnutrition in context of chronic illness     Aspirin. Resume beta blocker. Heparin.  Trend troponin. Obtain echocardiogram. Cardiology consultation.  Continue empiric antibiotics for UTI.  Pending studies:   Urine culture  Echocardiogram  Code Status: DNR DVT prophylaxis: Heparin Family Communication: Discussed with family at bedside Disposition Plan: Home when improved  Brendia Sacks, MD  [facility]  Pager [phone] If 7PM-7AM, please contact night-coverage at [email or web address], password [facility] 06/05/2013, 8:23 AM  LOS: 1 day    Consultants:  Cardiology  Procedures:  2-D echocardiogram:  Antibiotics:  Ceftriaxone 10/6 >>   HPI/Subjective: No pain, no chest pain, no shortness of breath,  no head pain, no eye pain. No new visual problems. No difficulty moving eyes.  Objective: Filed Vitals:   06/04/13 2041 06/04/13 2154 06/04/13 2220 06/05/13 0554  BP: 141/76 120/55 116/65 108/58  Pulse: 100 88 92 70  Temp:  98 F (36.7 C) 100.3 F (37.9 C) 98.2 F (36.8 C)  TempSrc:  Oral Oral Oral  Resp: 19 20 20 18   Height:      Weight:      SpO2: 93% 93% 94% 96%    Intake/Output Summary (Last 24 hours) at 06/05/13 0823 Last data filed at 06/05/13 0700  Gross per 24 hour  Intake      0 ml  Output   2050 ml  Net  -2050 ml     Filed Weights   06/04/13 1551  Weight: 58.968 kg (130 lb)    Exam:   Febrile 101.8, vital signs stable.  General: Appears calm and comfortable, eating breakfast. Well-appearing.  Cardiovascular: 2/6 systolic murmur. No rub or gallop. Regular rate and rhythm.  Telemetry: Sinus rhythm. 2 episodes of nonsustained V. tach.  Respiratory: Clear to auscultation bilaterally. No wheezes, rales or rhonchi. Normal respiratory effort.  Musculoskeletal: Grossly normal bilateral upper and lower extremities.  Psychiatric: Grossly normal mood and affect. Speech fluent and appropriate.  Head: Head appears grossly unremarkable. The eyes appear normal. There is no facial edema, bruising. Extraocular movements are intact. No evidence of damage to the left is seen.  Data Reviewed:  Complete metabolic panel unremarkable except for modest elevation of AST.  Troponin markedly elevated 18.34  White blood cell count 12, hemoglobin 8.5   urinalysis grossly positive  Scheduled Meds: . aspirin EC  81 mg Oral Daily  . cefTRIAXone (ROCEPHIN)  IV  1 g Intravenous Q24H  . enoxaparin (LOVENOX) injection  40 mg Subcutaneous Q24H  . sodium  chloride  3 mL Intravenous Q12H   Continuous Infusions: . 0.9 % NaCl with KCl 20 mEq / L 1,000 mL (06/05/13 0400)    Principal Problem:   NSTEMI (non-ST elevated myocardial infarction) Active Problems:   HYPERLIPIDEMIA    ANEMIA, NORMOCYTIC   HYPERTENSION   PAROXYSMAL ATRIAL FIBRILLATION   Mitral valvular regurgitation   Syncope   UTI (lower urinary tract infection)   Orthostasis   Time spent 35  minutesGreater than 50% in counseling and coordination of care

## 2013-06-05 NOTE — H&P (Signed)
Primary cardiologist:  Clinical Summary Mr. Calixte is a 79 y.o.male with history of paroxysmal afib, COPD, HL, PAD, and  CAD last cath showed multivessel disease 2010 as described in the setting of NSTEMI thought to be secondary to AFIB with RVR in the setting of CAD.   Patient presented to [facility] after experiencing a syncopal episode. He was standing in line at the bank acutely felt week and fell on his side. He denies any chest pain, palpitations, SOB. Its unclear if he lost consciousness from the available records. He was found to be orthostatic in the ER, started on IVF and admitted to the medicine floor. His initial troponin was negative, however increased to 18 and then greater than 20. EKG shows ST elevation in AVR with diffuse ST depressions suggestive of left main disease or severe 3 vessel disease. Based on high risk features, patient was transferred to [facility] for further evaluation and management. Patient stated he would be willing to have invasive procedures done.    No Known Allergies  Home Medications Prescriptions prior to admission  Medication Sig Dispense Refill  . aspirin 81 MG tablet Take 81 mg by mouth every morning.       . metoprolol succinate (TOPROL-XL) 25 MG 24 hr tablet Take 25 mg by mouth every morning.         Scheduled Medications . aspirin EC  81 mg Oral Daily  . [START ON 06/06/2013] atorvastatin  80 mg Oral q1800  . cefTRIAXone (ROCEPHIN)  IV  1 g Intravenous Q24H  . [START ON 06/06/2013] clopidogrel  75 mg Oral Q breakfast  . feeding supplement (ENSURE COMPLETE)  237 mL Oral BID  . metoprolol succinate  12.5 mg Oral q morning - 10a  . sodium chloride  3 mL Intravenous Q12H     Infusions . 0.9 % NaCl with KCl 20 mEq / L 75 mL/hr at 06/05/13 1637  . heparin 950 Units/hr (06/05/13 1731)     PRN Medications  acetaminophen, ondansetron (ZOFRAN) IV, sodium phosphate, traZODone  Past Medical History  Diagnosis Date  . Heart  disease   . MI, old   . Mitral valvular regurgitation 05/24/2013  . Atrial fibrillation   . Hypertension     History reviewed. No pertinent past surgical history.  History reviewed. No pertinent family history.  Social History Mr. Magan reports that he has been smoking.  He does not have any smokeless tobacco history on file. Mr. Sciandra reports that  drinks alcohol.  Review of Systems   Physical Examination Temp:  [97.3 F (36.3 C)-102.8 F (39.3 C)] 97.3 F (36.3 C) (10/07 1859) Pulse Rate:  [67-106] 70 (10/07 1859) Resp:  [15-20] 15 (10/07 1859) BP: (80-147)/(41-79) 143/79 mmHg (10/07 1859) SpO2:  [93 %-100 %] 100 % (10/07 1540) Weight:  [126 lb 1.7 oz (57.2 kg)] 126 lb 1.7 oz (57.2 kg) (10/07 1859)  Intake/Output Summary (Last 24 hours) at 06/05/13 1942 Last data filed at 06/05/13 1826  Gross per 24 hour  Intake 1701.7 ml  Output   2002 ml  Net -300.3 ml   Gen: NAD CV: RRR, no m/r/g, no JVD Pulm: CTAB Abd: soft, NT, ND Ext: warm, no edema Neuro: no focal deficits    Lab Results  Basic Metabolic Panel:  Recent Labs Lab 06/04/13 1640 06/05/13 0526  NA 142 143  K 3.9 3.5  CL 106 107  CO2 27 28  GLUCOSE 104* 108*  BUN 23 21  CREATININE  1.05 0.93  CALCIUM 9.0 8.4  MG  --  1.9    Liver Function Tests:  Recent Labs Lab 06/04/13 1640 06/05/13 0526  AST 14 58*  ALT <5 8  ALKPHOS 55 44  BILITOT 0.3 0.5  PROT 7.2 6.2  ALBUMIN 3.3* 2.7*    CBC:  Recent Labs Lab 06/04/13 1640 06/05/13 0526  WBC 9.9 12.0*  NEUTROABS 8.1*  --   HGB 9.6* 8.5*  HCT 29.0* 25.7*  MCV 89.5 89.9  PLT 207 173    Cardiac Enzymes:  Recent Labs Lab 06/04/13 1640 06/05/13 0658 06/05/13 1144  TROPONINI <0.30 18.34* >20.00*    BNP: No components found with this basename: POCBNP,    ECG: sinus rhythm, ST elevation AVR with diffuse ST depressions    Imaging Cath Jan 2010 RESULTS:  1. Hemodynamic monitoring. His central aortic pressure was  117/53.  His left ventricular pressure was 117/3 and there was no aortic  valve gradient noted at the time of pullback.  2. Ventriculography: Ventriculography formed at the end of the  procedure using 20 mL of contrast at 12 mL per second revealing the  posterior basilar and inferior wall to be hypokinetic. The  ejection fraction was 40-45%. There was +2 mitral regurgitation  appreciated.  3. Coronary arteriography: Dense calcification in the left main and  proximal half the LAD was noted.  a. Left main. The left main was a large vessel at about 4 mm  but it had terminal 70% narrowing. This extended into the ostium  of both the LAD and the circumflex.  b. Circumflex. There was ostial 60% narrowing in a large  circumflex system. There were mild proximal irregularities in the  ongoing circumflex with remainder of the ongoing circumflex was  free of disease. The first OM had ostial 70% narrowing, it was a  medium to large vessel.  c. LAD. The entire proximal half of the LAD was densely  calcified. There was a mild 40-50% irregularities throughout.  This included the ostium. There was stent in the midportion of  the LAD that was widely patent and the distal LAD did not show  significant disease. There was a small diagonal that was free of  disease.  d. Right coronary artery. This vessel was 100% occluded in its  midportion. There was late filling right to right collaterals of  the PDA and there were large collaterals from left to right.  CONCLUSION:  1. 45% ejection fraction.  2. +2 mitral regurgitation.  3. Terminal left main disease for about 70% with extension into the  ostium of the circumflex in particular.  4. Moderate stenosis in the ostium of OM #2.  5. Diffusely diseased with moderate stenosis in the LAD.  6. Total occlusion of the right coronary artery with left-to-right  collaterals.  When compared to the 2007 cardiac catheterization, the occlusion of the  right coronary  artery is clearly new and there has been substantial  progression of the left main disease.  I have the plan to discuss this with my colleagues, but I would try him  on medical therapy thinking that perhaps the event that raised his  cardiac markers was related to the occlusion of the RCA. If he  continues to have angina; however, there is nothing that can be fixed  percutaneously because of the high-grade left main stenosis and CABG may  be the only alternative, but at 89 I am not sure he would ever fully  recover.  Assessment/Plan 1. NSTEMI - patient presented with syncope. Significant elevation of troponin with EKG consistent with severe left main disease vs.severe multivessel disease. He is known to have significant disease from prior cath in 2010, and is transferred for consultation by interventional cardiology to evaluation if any form of intervention can be considered in this acute setting given his known anatomy vs. conservative therapy only . - continue ASA, plavix, beta blocker, statin, and anticoagulation. If hemodynamics remain stable start ACE-I.      Dina Rich, M.D., F.A.C.C.

## 2013-06-05 NOTE — Progress Notes (Signed)
ANTICOAGULATION CONSULT NOTE - Initial Consult  Pharmacy Consult for Heparin Indication: chest pain/ACS - NSTEMI  No Known Allergies  Patient Measurements: Height: 5\' 8"  (172.7 cm) Weight: 130 lb (58.968 kg) IBW/kg (Calculated) : 68.4  Vital Signs: Temp: 98.2 F (36.8 C) (10/07 0554) Temp src: Oral (10/07 0554) BP: 108/58 mmHg (10/07 0554) Pulse Rate: 70 (10/07 0554)  Labs:  Recent Labs  06/04/13 1640 06/05/13 0526 06/05/13 0658  HGB 9.6* 8.5*  --   HCT 29.0* 25.7*  --   PLT 207 173  --   CREATININE 1.05 0.93  --   TROPONINI <0.30  --  18.34*    Estimated Creatinine Clearance: 42.3 ml/min (by C-G formula based on Cr of 0.93).  Medical History: Past Medical History  Diagnosis Date  . Heart disease   . MI, old   . Mitral valvular regurgitation 05/24/2013  . Atrial fibrillation   . Hypertension     Medications:  Infusions:  . 0.9 % NaCl with KCl 20 mEq / L 1,000 mL (06/05/13 0400)  . heparin      Assessment: 79yo male with syncopal episode.  In ED pt was noted to have markedly abnormal EKG and cardiology recommended admission.  Troponin now markedly elevated.  Initiating IV Heparin.  Goal of Therapy:  Heparin level 0.3-0.7 units/ml Monitor platelets by anticoagulation protocol: Yes   Plan:  Heparin 3000 unit bolus then Heparin infusion at 12 units/Kg/hr Check heparin level in 6-8 hrs then daily CBC daily  Maciel Kegg A 06/05/2013,9:00 AM

## 2013-06-05 NOTE — Consult Note (Signed)
Consulting cardiologist:Sasha Rogel MD  Clinical Summary Jeffrey [name] is a 79 y.o.male remote history of CAD with prior stents 10 years ago per his report admitted after having syncopal episode [place] at the bank. Denies any significant prodrome. Admitted overnight, cardiology consulted in morning for abnormal EKG and elevated troponin. Trop initially normal, now greater than 20. EKG shows ST elevation in AVR with diffuse ST depressions suggestive of left main disease or severe multivesel disease. Patient without chest pain SOB currently, overall feels well. He is 79 years old but lives independently and is functional. I initially discussed his case with him and his daughter earlier this afternoon, informed of his MI and that for aggressive management he needs to be transferred to [facility] for a cath. He asked for time to think it over and talk with his family, he has just made me aware that he would like to be transferred for the procedure. He is on anticoag and I have written to load him with 600mg  of plavix, he is on aspirin, beta blocker. Will give high dose statin as well. He is hemodynamically but high risk NSTEMI.    No Known Allergies  Medications Scheduled Medications: . aspirin EC  81 mg Oral Daily  . cefTRIAXone (ROCEPHIN)  IV  1 g Intravenous Q24H  . clopidogrel  600 mg Oral Once  . feeding supplement (ENSURE COMPLETE)  237 mL Oral BID  . metoprolol succinate  12.5 mg Oral q morning - 10a  . sodium chloride  3 mL Intravenous Q12H     Infusions: . 0.9 % NaCl with KCl 20 mEq / L 1,000 mL (06/05/13 0400)  . heparin 700 Units/hr (06/05/13 0939)     PRN Medications:  acetaminophen, ondansetron (ZOFRAN) IV, sodium phosphate, traZODone   Past Medical History  Diagnosis Date  . Heart disease   . MI, old   . Mitral valvular regurgitation 05/24/2013  . Atrial fibrillation   . Hypertension     History reviewed. No pertinent past surgical history.  History reviewed.  No pertinent family history.  Social History [name] reports that he has been smoking.  He does not have any smokeless tobacco history on file. [name] reports that  drinks alcohol.  Review of Systems CONSTITUTIONAL: No weight loss, fever, chills, weakness or fatigue.   SKIN: No rash or itching.  CARDIOVASCULAR: per HPI RESPIRATORY: No shortness of breath, cough or sputum.  GASTROINTESTINAL: No anorexia, nausea, vomiting or diarrhea. No abdominal pain or blood.  GENITOURINARY: no polyuria, no dysuria NEUROLOGICAL: No headache, dizziness, syncope, paralysis, ataxia, numbness or tingling in the extremities. No change in bowel or bladder control.  MUSCULOSKELETAL: No muscle, back pain, joint pain or stiffness.  HEMATOLOGIC: No anemia, bleeding or bruising.  LYMPHATICS: No enlarged nodes. No history of splenectomy.  PSYCHIATRIC: No history of depression or anxiety.      Physical Examination Blood pressure 80/43, pulse 88, temperature 98.1 F (36.7 C), temperature source Oral, resp. rate 20, height 5\' 8"  (1.727 m), weight 130 lb (58.968 kg), SpO2 93.00%.  Intake/Output Summary (Last 24 hours) at 06/05/13 1516 Last data filed at 06/05/13 0700  Gross per 24 hour  Intake      0 ml  Output   2050 ml  Net  -2050 ml    Cardiovascular: RRR, no m/r/g, no JVD  Respiratory: coarse breath sounds bilaterally  GI: abdomen soft, NT, ND  MSK: extremities warm, no edema  Neuro: no focal deficits.   Psych:  appropriate affect   Lab Results  Basic Metabolic Panel:  Recent Labs Lab 06/04/13 1640 06/05/13 0526  NA 142 143  K 3.9 3.5  CL 106 107  CO2 27 28  GLUCOSE 104* 108*  BUN 23 21  CREATININE 1.05 0.93  CALCIUM 9.0 8.4  MG  --  1.9    Liver Function Tests:  Recent Labs Lab 06/04/13 1640 06/05/13 0526  AST 14 58*  ALT <5 8  ALKPHOS 55 44  BILITOT 0.3 0.5  PROT 7.2 6.2  ALBUMIN 3.3* 2.7*    CBC:  Recent Labs Lab 06/04/13 1640 06/05/13 0526  WBC  9.9 12.0*  NEUTROABS 8.1*  --   HGB 9.6* 8.5*  HCT 29.0* 25.7*  MCV 89.5 89.9  PLT 207 173    Cardiac Enzymes:  Recent Labs Lab 06/04/13 1640 06/05/13 0658 06/05/13 1144  TROPONINI <0.30 18.34* >20.00*    BNP: No components found with this basename: POCBNP,    ECG: sinus rhythm, ST elevation AVR with diffuse ST depressions   Imaging EXAM:  CHEST - 1 VIEW  COMPARISON: 10/06/2012  FINDINGS:  Enlargement of cardiac silhouette.  Calcified mild tortuous thoracic aorta.  Pulmonary vascularity normal.  Lungs appear emphysematous but clear.  No pleural effusion or pneumothorax.  Osseous structures unremarkable.  IMPRESSION:  Minimal enlargement of cardiac silhouette.  COPD changes.  No acute abnormalities   Assessment/Plan 1. NSTEMI - patient admitted with syncope, EKG consistent with ischemia likely left main or severe multivessel based on pattern with significantly elevated troponin. He is high risk given presentation. He is elderly but functional and independent, he initially wanted time to consider the procedure but now is agrees to transfer and aggressive management - load w/ plavix 600mg  now, continue ASA, beta blocker, heparin. Can add ACE on if bp remains stable.        Dina Rich, M.D., F.A.C.C.

## 2013-06-05 NOTE — Care Management Note (Signed)
    Page 1 of 2   06/08/2013     12:40:19 PM   CARE MANAGEMENT NOTE 06/08/2013  Patient:  Jeffrey [name], Jeffrey [name]   Account Number:  [id number]  Date Initiated:  06/05/2013  Documentation initiated by:  Rosemary Holms  Subjective/Objective Assessment:   Pt lives alone but his girlfriend assists him when he is sick. Per significant other, they have been together for 11 years.     Action/Plan:   Anticipated DC Date:     Anticipated DC Plan:        DC Planning Services  CM consult      Choice offered to / List presented to:  C-4 Adult Children        HH arranged  HH-2 PT      HH agency  [facility]   Status of service:  Completed, signed off Medicare Important Message given?   (If response is "NO", the following Medicare IM given date fields will be blank) Date Medicare IM given:   Date Additional Medicare IM given:    Discharge Disposition:  HOME W HOME HEALTH SERVICES  Per UR Regulation:  Reviewed for med. necessity/level of care/duration of stay  If discussed at Long Length of Stay Meetings, dates discussed:    Comments:   06-08-13 1239 Tomi Bamberger, RN,BSN [phone] CM did make referral with [facility] for services. SOC to begin within 24-48 hours post d/c.  06-08-13 1055 Tomi Bamberger, RN,BSN [phone] CM was able to speak to pt and his daughter. CM did ask if pt will need dme for home and daughter has RW that pt can use. PT was consulted again today due to pt having some weakness. Pt was seen at [facility] and recommendations were for Outpatient PT services. CM will f/u.  06/05/13 Rosemary Holms RN BSN CM [phone] Spoke with pt and his girlfriend at bedside. Discussed PT recommendation of a walker. Pt not sure he will need one. Pt states he will consider it but we agreed to talk again tomorrow to see how he feels.

## 2013-06-05 NOTE — Progress Notes (Signed)
INITIAL NUTRITION ASSESSMENT  DOCUMENTATION CODES Per approved criteria  -Severe malnutrition in the context of chronic illness   Pt meets criteria for severe MALNUTRITION in the context of chronic illness as evidenced by severe muscle wasting and subcutaneous fat loss.  INTERVENTION: Ensure Complete po BID, each supplement provides 350 kcal and 13 grams of protein.  NUTRITION DIAGNOSIS: Malnutrition (severe) related to nutrition focused exam as evidenced by severe subcutaneous fat loss (biceps/triceps) and muscle wasting (clavicle, anterior thigh & calf region).   Goal: Pt to meet >/= 90% of their estimated nutrition needs   Monitor:  Po intake, labs and wt trends  Reason for Assessment: Malnutrition Screen Score = 2  79 y.o. male  Admitting Dx: NSTEMI (non-ST elevated myocardial infarction)  ASSESSMENT: Pt lives alone. Home diet is regular. Reports appetite as fair. Has several missing teeth but denies chewing difficulty. Increased weakness recently and has fallen twice in the last few months.   Patient Active Problem List   Diagnosis Date Noted  . Syncope 06/05/2013  . UTI (lower urinary tract infection) 06/05/2013  . Orthostasis 06/05/2013  . NSTEMI (non-ST elevated myocardial infarction) 06/05/2013  . Mitral valvular regurgitation 05/24/2013  . Pre-syncope 03/28/2013  . WEIGHT LOSS 04/10/2009  . TOE PAIN 01/03/2009  . PAROXYSMAL ATRIAL FIBRILLATION 10/03/2008  . CONSTIPATION 07/04/2008  . SMOKER 12/15/2007  . BLADDER NECK OBSTRUCTION 09/15/2007  . PVD 05/22/2007  . ANEMIA, NORMOCYTIC 01/05/2007  . HYPERLIPIDEMIA 01/03/2007  . ERECTILE DYSFUNCTION 01/03/2007  . TREMOR, ESSENTIAL 01/03/2007  . CATARACT NOS 01/03/2007  . HYPERTENSION 01/03/2007  . CAD 01/03/2007  . DIASTOLIC DYSFUNCTION 01/03/2007  . CAROTID ARTERY STENOSIS 01/03/2007  . NEPHROLITHIASIS 01/03/2007  . OVERACTIVE BLADDER 01/03/2007  . EMBOLISM, OB BLOOD-CLOT , UNSPECIFIED EPISODE 01/03/2007  .  BENIGN PROSTATIC HYPERTROPHY, HX OF 01/03/2007   Nutrition Focused Physical Exam:  Subcutaneous Fat loss:  Orbital Region: WNL Upper Arm Region: severe Thoracic and Lumbar Region: Moderate  Muscle wasting:  Temple Region: moderate Clavicle Bone Region: severe Clavicle and Acromion Bone Region: severe Scapular Bone Region: Moderate Dorsal Hand: moderate Patellar Region: severe Anterior Thigh Region: severe Posterior Calf Region: severe  Edema: none noted   Height: Ht Readings from Last 1 Encounters:  06/04/13 5\' 8"  (1.727 m)    Weight: Wt Readings from Last 1 Encounters:  06/04/13 130 lb (58.968 kg)    Ideal Body Weight: 154# (70 kg)   % Ideal Body Weight: 84%  Wt Readings from Last 10 Encounters:  06/04/13 130 lb (58.968 kg)  05/24/13 126 lb (57.153 kg)  05/24/13 125 lb (56.7 kg)  03/28/13 128 lb (58.06 kg)  02/28/13 190 lb (86.183 kg)  10/06/12 135 lb (61.236 kg)  09/25/11 135 lb (61.236 kg)  04/10/09 141 lb (63.957 kg)  01/03/09 149 lb (67.586 kg)  10/31/08 153 lb (69.4 kg)    Usual Body Weight: 135#  % Usual Body Weight: 96%  BMI:  Body mass index is 19.77 kg/(m^2).normal range  Estimated Nutritional Needs: Kcal: 4540-9811 Protein: 70-80 gr Fluid: 1800 ml/day  Skin: No issues noted  Diet Order: Cardiac /feeds himself.   EDUCATION NEEDS: -Education needs addressed regarding nutrition needs   Intake/Output Summary (Last 24 hours) at 06/05/13 1224 Last data filed at 06/05/13 0700  Gross per 24 hour  Intake      0 ml  Output   2050 ml  Net  -2050 ml    Last BM: 06/03/13  Labs:   Recent Labs Lab 06/04/13 1640 06/05/13  0526  NA 142 143  K 3.9 3.5  CL 106 107  CO2 27 28  BUN 23 21  CREATININE 1.05 0.93  CALCIUM 9.0 8.4  MG  --  1.9  GLUCOSE 104* 108*    CBG (last 3)  No results found for this basename: GLUCAP,  in the last 72 hours  Scheduled Meds: . aspirin EC  81 mg Oral Daily  . cefTRIAXone (ROCEPHIN)  IV  1 g  Intravenous Q24H  . heparin  3,000 Units Intravenous Once  . metoprolol succinate  12.5 mg Oral q morning - 10a  . sodium chloride  3 mL Intravenous Q12H    Continuous Infusions: . 0.9 % NaCl with KCl 20 mEq / L 1,000 mL (06/05/13 0400)  . heparin 700 Units/hr (06/05/13 8469)    Past Medical History  Diagnosis Date  . Heart disease   . MI, old   . Mitral valvular regurgitation 05/24/2013  . Atrial fibrillation   . Hypertension     History reviewed. No pertinent past surgical history.  Royann Shivers MS,RD,LDN,CSG Office: [phone] Pager: [phone]

## 2013-06-05 NOTE — H&P (Signed)
[facility] History and Physical  Jeffrey [name]  ZOX:[id number]  DOB: Mar 14, 1918   DOA: 06/04/2013   PCP:   [name], PA-C   Chief Complaint:  Syncopal episode while standing at the back  HPI: Jeffrey [name] is a 79 y.o. male.   Elderly African American gentleman brought in because of a syncopal episode while standing waiting in line at the bank. He apparently has not eaten today, and evaluation in the emergency room revealed evidence of a urinary tract infection and an abnormal EKG.  EKG was reviewed with cardiologist, who felt it was not a cause for concern and that the syncopal was likely noncardiac related.     Rewiew of Systems:   Denies any other acute medical problem   Past Medical History  Diagnosis Date  . Heart disease   . MI, old   . Mitral valvular regurgitation 05/24/2013  . Atrial fibrillation   . Hypertension     History reviewed. No pertinent past surgical history.  Medications:  HOME MEDS: Prior to Admission medications   Medication Sig Start Date End Date Taking? Authorizing Provider  aspirin 81 MG tablet Take 81 mg by mouth every morning.    Yes Historical Provider, MD  metoprolol succinate (TOPROL-XL) 25 MG 24 hr tablet Take 25 mg by mouth every morning.    Yes Historical Provider, MD     Allergies:  No Known Allergies  Social History:   reports that he has been smoking.  He does not have any smokeless tobacco history on file. He reports that  drinks alcohol. He reports that he does not use illicit drugs.  Family History: History reviewed. No pertinent family history.   Physical Exam: Filed Vitals:   06/04/13 1953 06/04/13 2041 06/04/13 2154 06/04/13 2220  BP:  141/76 120/55 116/65  Pulse:  100 88 92  Temp: 102.8 F (39.3 C)  98 F (36.7 C) 100.3 F (37.9 C)  TempSrc: Oral  Oral Oral  Resp:  19 20 20   Height:      Weight:      SpO2:  93% 93% 94%   Blood pressure 116/65, pulse 92, temperature 100.3 F (37.9 C),  temperature source Oral, resp. rate 20, height 5\' 8"  (1.727 m), weight 58.968 kg (130 lb), SpO2 94.00%. Body mass index is 19.77 kg/(m^2).   GEN:  Pleasant elderly African American gentleman resting comfortably in bed in no acute distress;  PSYCH:  Sleeping at the time of exam resists arousal;  ; affect is appropriate. HEENT: Mucous membranes pink and anicteric; PERRLA; EOM intact;  Breasts:: Not examined CHEST WALL: No tenderness CHEST: Normal respiration, clear to auscultation bilaterally HEART: Regular rate and rhythm; 3/6 rough systolic murmur radiating to axilla ABDOMEN:  soft non-tender; no masses, no organomegaly, normal abdominal bowel sounds;  Rectal Exam: Not done EXTREMITIES: age-appropriate arthropathy of the hands and knees; no edema; no ulcerations. Genitalia: not examined PULSES: 2+ and symmetric SKIN: Normal hydration no rash or ulceration CNS: Cranial nerves 2-12 grossly intact no focal lateralizing neurologic deficit   Labs on Admission:  Basic Metabolic Panel:  Recent Labs Lab 06/04/13 1640  NA 142  K 3.9  CL 106  CO2 27  GLUCOSE 104*  BUN 23  CREATININE 1.05  CALCIUM 9.0   Liver Function Tests:  Recent Labs Lab 06/04/13 1640  AST 14  ALT <5  ALKPHOS 55  BILITOT 0.3  PROT 7.2  ALBUMIN 3.3*   No results found for this basename: LIPASE,  AMYLASE,  in the last 168 hours No results found for this basename: AMMONIA,  in the last 168 hours CBC:  Recent Labs Lab 06/04/13 1640  WBC 9.9  NEUTROABS 8.1*  HGB 9.6*  HCT 29.0*  MCV 89.5  PLT 207   Cardiac Enzymes:  Recent Labs Lab 06/04/13 1640  TROPONINI <0.30   BNP: No components found with this basename: POCBNP,  D-dimer: No components found with this basename: D-DIMER,  CBG: No results found for this basename: GLUCAP,  in the last 168 hours  Radiological Exams on Admission: Dg Chest 1 View  06/04/2013   CLINICAL DATA:  Left hip pain, post fall, cough, hypertension, smoker, MRI  EXAM:  CHEST - 1 VIEW  COMPARISON:  10/06/2012  FINDINGS: Enlargement of cardiac silhouette.  Calcified mild tortuous thoracic aorta.  Pulmonary vascularity normal.  Lungs appear emphysematous but clear.  No pleural effusion or pneumothorax.  Osseous structures unremarkable.  IMPRESSION: Minimal enlargement of cardiac silhouette.  COPD changes.  No acute abnormalities.   Electronically Signed   By: Ulyses Southward M.D.   On: 06/04/2013 17:08   Dg Hip Complete Left  06/04/2013   CLINICAL DATA:  Left hip pain post fall  EXAM: LEFT HIP - COMPLETE 2+ VIEW  COMPARISON:  None  FINDINGS: Osseous demineralization.  Mild narrowing of the hip joints bilaterally.  No acute fracture, dislocation, or bone destruction.  Numerous pelvic phleboliths.  IMPRESSION: No acute osseous abnormalities.   Electronically Signed   By: Ulyses Southward M.D.   On: 06/04/2013 17:08   Ct Head Wo Contrast  06/04/2013   *RADIOLOGY REPORT*  Clinical Data:  Initial evaluation for this 79 year old who had a syncopal episode earlier today and fell.  The patient is amnestic to the event.  Current complaint of headache.  CT HEAD WITHOUT CONTRAST CT CERVICAL SPINE WITHOUT CONTRAST  Technique:  Multidetector CT imaging of the head and cervical spine was performed following the standard protocol without intravenous contrast.  Multiplanar CT image reconstructions of the cervical spine were also generated.  Comparison:  None.  CT HEAD  Findings: Moderate to severe cortical and deep atrophy, moderate cerebellar atrophy, and severe changes of small vessel disease of the white matter diffusely.  No mass lesion.  No midline shift.  No acute hemorrhage or hematoma.  No extra-axial fluid collections. No evidence of acute infarction.  Air-fluid level in the left maxillary sinus with possible fracture involving the floor of the left orbit.  No fractures elsewhere involving the skull or the visualized facial bones. Remaining visualized paranasal sinuses, bilateral mastoid air  cells, and bilateral middle ear cavities well-aerated.  Lipoma involving the subcutaneous tissues of the left frontal region with a visible protrusion.  Extensive bilateral carotid siphon and vertebral artery atherosclerosis.  IMPRESSION:  1.  No acute intracranial abnormality. 2.  Moderate to severe generalized atrophy and severe chronic microvascular ischemic changes of the white matter. 3.  Possible left orbital floor fracture.  Air-fluid level in the left maxillary sinus. 4.  Lipoma involving the subcutaneous tissues of the left frontal region.  CT CERVICAL SPINE  Findings: No fractures identified involving the cervical spine. Sagittal reconstructed images demonstrate disc space narrowing and associated endplate hypertrophic changes at every cervical level. Mild spinal stenosis at the C3-4 level and moderate spinal stenosis at the C5-6 level.  Degenerative changes at the C1-C2 articulation. Facet joints intact with severe degenerative changes throughout; degenerative changes account for ankylosis of the left C3-4 facet joint.  Coronal reformatted images demonstrate intact craniocervical junction, intact dens, and intact lateral masses. Facet and uncinate hypertrophy account for multilevel foraminal stenoses including severe left and moderate right C2-3, severe bilateral C3-4, severe right and moderate left C4-5, severe bilateral C5-6, severe bilateral C6-7, and mild to moderate right C7-T1.  IMPRESSION:  1.  No cervical spine fractures identified. 2.  Multilevel degenerative disc disease, spondylosis, and facet degenerative changes as detailed above.   Original Report Authenticated By: Hulan Saas, M.D.   Ct Cervical Spine Wo Contrast  06/04/2013   *RADIOLOGY REPORT*  Clinical Data:  Initial evaluation for this 79 year old who had a syncopal episode earlier today and fell.  The patient is amnestic to the event.  Current complaint of headache.  CT HEAD WITHOUT CONTRAST CT CERVICAL SPINE WITHOUT CONTRAST   Technique:  Multidetector CT imaging of the head and cervical spine was performed following the standard protocol without intravenous contrast.  Multiplanar CT image reconstructions of the cervical spine were also generated.  Comparison:  None.  CT HEAD  Findings: Moderate to severe cortical and deep atrophy, moderate cerebellar atrophy, and severe changes of small vessel disease of the white matter diffusely.  No mass lesion.  No midline shift.  No acute hemorrhage or hematoma.  No extra-axial fluid collections. No evidence of acute infarction.  Air-fluid level in the left maxillary sinus with possible fracture involving the floor of the left orbit.  No fractures elsewhere involving the skull or the visualized facial bones. Remaining visualized paranasal sinuses, bilateral mastoid air cells, and bilateral middle ear cavities well-aerated.  Lipoma involving the subcutaneous tissues of the left frontal region with a visible protrusion.  Extensive bilateral carotid siphon and vertebral artery atherosclerosis.  IMPRESSION:  1.  No acute intracranial abnormality. 2.  Moderate to severe generalized atrophy and severe chronic microvascular ischemic changes of the white matter. 3.  Possible left orbital floor fracture.  Air-fluid level in the left maxillary sinus. 4.  Lipoma involving the subcutaneous tissues of the left frontal region.  CT CERVICAL SPINE  Findings: No fractures identified involving the cervical spine. Sagittal reconstructed images demonstrate disc space narrowing and associated endplate hypertrophic changes at every cervical level. Mild spinal stenosis at the C3-4 level and moderate spinal stenosis at the C5-6 level.  Degenerative changes at the C1-C2 articulation. Facet joints intact with severe degenerative changes throughout; degenerative changes account for ankylosis of the left C3-4 facet joint.  Coronal reformatted images demonstrate intact craniocervical junction, intact dens, and intact lateral  masses. Facet and uncinate hypertrophy account for multilevel foraminal stenoses including severe left and moderate right C2-3, severe bilateral C3-4, severe right and moderate left C4-5, severe bilateral C5-6, severe bilateral C6-7, and mild to moderate right C7-T1.  IMPRESSION:  1.  No cervical spine fractures identified. 2.  Multilevel degenerative disc disease, spondylosis, and facet degenerative changes as detailed above.   Original Report Authenticated By: Hulan Saas, M.D.      Assessment/Plan   Active Problems:   HYPERLIPIDEMIA   HYPERTENSION   PAROXYSMAL ATRIAL FIBRILLATION   Mitral valvular regurgitation   Syncope   UTI (lower urinary tract infection)   Orthostasis   PLAN: Admit for hydration, serial cardiac enzymes, treat urinary tract infection and sinusitis Reevaluate after hydration.  Other plans as per orders.  Code Status: Full code Family Communication:Plans discuss with daughter at bedside    Xavyer Steenson Nocturnist [facility] Pager [phone]   06/05/2013, 1:57 AM

## 2013-06-06 ENCOUNTER — Encounter (HOSPITAL_COMMUNITY)
Admission: EM | Disposition: A | Discharge status: Home Health | Payer: Self-pay | Source: Home / Self Care | Attending: Family Medicine

## 2013-06-06 DIAGNOSIS — I059 Rheumatic mitral valve disease, unspecified: Secondary | ICD-10-CM

## 2013-06-06 DIAGNOSIS — I2510 Atherosclerotic heart disease of native coronary artery without angina pectoris: Secondary | ICD-10-CM

## 2013-06-06 HISTORY — PX: LEFT HEART CATHETERIZATION WITH CORONARY ANGIOGRAM: SHX5451

## 2013-06-06 LAB — CBC
Hemoglobin: 8.4 g/dL — ABNORMAL LOW (ref 13.0–17.0)
MCH: 29.4 pg (ref 26.0–34.0)
MCHC: 33.6 g/dL (ref 30.0–36.0)
MCV: 87.4 fL (ref 78.0–100.0)
RDW: 13.8 % (ref 11.5–15.5)

## 2013-06-06 LAB — PROTIME-INR
INR: 1.22 (ref 0.00–1.49)
Prothrombin Time: 15.1 seconds (ref 11.6–15.2)

## 2013-06-06 LAB — HEPARIN LEVEL (UNFRACTIONATED): Heparin Unfractionated: 0.37 IU/mL (ref 0.30–0.70)

## 2013-06-06 LAB — POCT ACTIVATED CLOTTING TIME: Activated Clotting Time: 181 seconds

## 2013-06-06 SURGERY — LEFT HEART CATHETERIZATION WITH CORONARY ANGIOGRAM
Anesthesia: LOCAL

## 2013-06-06 MED ORDER — SODIUM CHLORIDE 0.9 % IV SOLN
INTRAVENOUS | Status: AC
  Administered 2013-06-06: 18:00:00 via INTRAVENOUS

## 2013-06-06 MED ORDER — LIDOCAINE HCL (PF) 1 % IJ SOLN
INTRAMUSCULAR | Status: AC
  Filled 2013-06-06: qty 30

## 2013-06-06 MED ORDER — HEPARIN (PORCINE) IN NACL 2-0.9 UNIT/ML-% IJ SOLN
INTRAMUSCULAR | Status: AC
  Filled 2013-06-06: qty 1500

## 2013-06-06 MED ORDER — NITROGLYCERIN 0.2 MG/ML ON CALL CATH LAB
INTRAVENOUS | Status: AC
  Filled 2013-06-06: qty 1

## 2013-06-06 NOTE — CV Procedure (Signed)
Cardiac Catheterization Operative Report  Jeffrey [name] [id number] 10/8/20145:00 PM [name], PA-C  Procedure Performed:  1. Left Heart Catheterization 2. Selective Coronary Angiography 3. Left ventricular pressures  Operator: Verne Carrow, MD  Arterial access site:  Right radial artery.   Indication: 79 yo male with history of severe CAD with last cath in 2010 per [facility] with severe distal Left main stenosis, severe LAD/Circumflex disease, occluded RCA. He is now admitted after a syncopal episode. He has ruled in for an MI with troponin >20.                                       Procedure Details: The risks, benefits, complications, treatment options, and expected outcomes were discussed with the patient. The patient and/or family concurred with the proposed plan, giving informed consent. The patient was brought to the cath lab after IV hydration was begun and oral premedication was given. The right wrist was assessed with an Allens test which was positive. The right wrist was prepped and draped in a sterile fashion. 1% lidocaine was used for local anesthesia. Using the modified Seldinger access technique, a 5 French sheath was placed in the right radial artery. 3 mg Verapamil was given through the sheath. 3000 units IV heparin was given. The pt had very tortuous vessels and it was very difficult to pass the catheter into the aortic arch. I was able to advance the catheter into the ascending aorta but I could not engage the RCA due to inability to torque the catheter. I elected to gain access into the right femoral artery. A 5 French sheath was placed in there right femoral artery. Standard diagnostic catheters were used to perform selective coronary angiography. A pigtail catheter was used to measure LV pressures. The sheath was removed from the right radial artery and a Terumo hemostasis band was applied at the arteriotomy site on the right wrist.    There were no  immediate complications. The patient was taken to the recovery area in stable condition.   Hemodynamic Findings: Central aortic pressure: 132/64 Left ventricular pressure: 133/9/20  Angiographic Findings:  Left main: 70-80% distal stenosis.   Left Anterior Descending Artery: Large caliber vessel that courses to the apex. The ostium has a 80% stenosis. The entire proximal and mid vessel is heavily calcified. There is diffuse 70% stenosis throughout the proximal segment. The mid vessel has diffuse 40% stenosis. There is a patent stent in the mid vessel without restenosis. There are several small diagonal branches that have mild plaque disease.   Circumflex Artery: Large caliber vessel with ostial 60% stenosis, 50% proximal stenosis. There is a moderate caliber obtuse marginal branch with ostial 60% stenosis and mid 90% stenosis.   Right Coronary Artery:  Large dominant vessel with diffuse 70% proximal stenosis, 100% mid stenosis. There are left to right collaterals.   Left Ventricular Angiogram: Deferred.   Impression: 1. Severe triple vessel CAD, left main disease.  2. NSTEMI with multiple potential areas of ischemia.   Recommendations: He has severe left main disease and diffuse disease in all major epicardial vessels. PCI is not an option. He would best be treated with CABG but given advanced age, he would likely not do well with CABG. He was deemed to not be a surgical candidate in 2010. He is now 79 years old. I would favor continued medical management.  Complications:  None. The patient tolerated the procedure well.

## 2013-06-06 NOTE — Progress Notes (Signed)
Patient Name: Jeffrey [name] Date of Encounter: 06/06/2013     SUBJECTIVE:    Patient is feeling well today. There are many family members in the room. He's not having any recurring chest pain.  OBJECTIVE Filed Vitals:   06/06/13 0429 06/06/13 0500 06/06/13 0600 06/06/13 0836  BP: 118/66 117/61 128/66 127/69  Pulse: 64 59 61 62  Temp: 98 F (36.7 C)   98.1 F (36.7 C)  TempSrc: Oral   Oral  Resp: 15 13 12 17   Height:      Weight: 126 lb 1.7 oz (57.2 kg)     SpO2: 95% 98% 99% 100%    Intake/Output Summary (Last 24 hours) at 06/06/13 1103 Last data filed at 06/05/13 2200  Gross per 24 hour  Intake 1818.7 ml  Output      2 ml  Net 1816.7 ml   Filed Weights   06/05/13 1859 06/05/13 2147 06/06/13 0429  Weight: 126 lb 1.7 oz (57.2 kg) 126 lb 1.7 oz (57.2 kg) 126 lb 1.7 oz (57.2 kg)   Patient is oriented to person time and place. Affect is normal. There is no jugulovenous distention. He is lying flat in bed. Lung exam does raise a question of some rales. There is no respiratory distress. Cardiac exam reveals S1 and S2 and a systolic murmur. The abdomen is soft. There is no peripheral edema.  LABS: CBC: Recent Labs  06/04/13 1640 06/05/13 0526  WBC 9.9 12.0*  NEUTROABS 8.1*  --   HGB 9.6* 8.5*  HCT 29.0* 25.7*  MCV 89.5 89.9  PLT 207 173   INR: Recent Labs  06/06/13 0640  INR 1.22   Basic Metabolic Panel: Recent Labs  06/04/13 1640 06/05/13 0526  NA 142 143  K 3.9 3.5  CL 106 107  CO2 27 28  GLUCOSE 104* 108*  BUN 23 21  CREATININE 1.05 0.93  CALCIUM 9.0 8.4  MG  --  1.9   Liver Function Tests: Recent Labs  06/04/13 1640 06/05/13 0526  AST 14 58*  ALT <5 8  ALKPHOS 55 44  BILITOT 0.3 0.5  PROT 7.2 6.2  ALBUMIN 3.3* 2.7*   Cardiac Enzymes: Recent Labs  06/04/13 1640 06/05/13 0658 06/05/13 1144  TROPONINI <0.30 18.34* >20.00*   Hemoglobin A1C: Recent Labs  06/05/13 0348  HGBA1C 6.3*   Thyroid Function Tests: Recent  Labs  06/05/13 0348  TSH 0.787    TELE:     I have reviewed telemetry today June 06, 2013. There is normal sinus rhythm.  Radiology/Studies: Dg Chest 1 View 06/04/2013   CLINICAL DATA:  Left hip pain, post fall, cough, hypertension, smoker, MRI  EXAM: CHEST - 1 VIEW  COMPARISON:  10/06/2012  FINDINGS: Enlargement of cardipneumothorax.  Osseous structures unremarkable.  IMPRESSION: Minimal enlargement of cardiac silhouette.  COPD changes.  No acute abnormalities.  ac silhouette.  Calcified mild tortuous thoracic aorta.  Pulmonary vascularity normal.  Lungs appear emphysematous but clear.  No pleural effusion or PTX  Electronically Signed   By: Ulyses Southward M.D.   On: 06/04/2013 17:08   Dg Hip Complete Left 06/04/2013   CLINICAL DATA:  Left hip pain post fall  EXAM: LEFT HIP - COMPLETE 2+ VIEW  COMPARISON:  None  FINDINGS: Osseous demineralization.  Mild narrowing of the hip joints bilaterally.  No acute fracture, dislocation, or bone destruction.  Numerous pelvic phleboliths.  IMPRESSION: No acute osseous abnormalities.   Electronically Signed   By: Loraine Leriche  Tyron Russell M.D.   On: 06/04/2013 17:08    Ct Cervical Spine Wo Contrast 06/04/2013   *RADIOLOGY REPORT*  Clinical Data:  Initial evaluation for this 79 year old who had a syncopal episode earlier today and fell.  The patient is amnestic to the event.  Current complaint of headache.  CT HEAD WITHOUT CONTRAST CT CERVICAL SPINE WITHOUT CONTRAST  Technique:  Multidetector CT imaging of the head and cervical spine was performed following the standard protocol without intravenous contrast.  Multiplanar CT image reconstructions of the cervical spine were also generated.  Comparison:  None.  CT HEAD  Findings: Moderate to severe cortical and deep atrophy, moderate cerebellar atrophy, and severe changes of small vessel disease of the white matter diffusely.  No mass lesion.  No midline shift.  No acute hemorrhage or hematoma.  No extra-axial fluid collections. No  evidence of acute infarction.  Air-fluid level in the left maxillary sinus with possible fracture involving the floor of the left orbit.  No fractures elsewhere involving the skull or the visualized facial bones. Remaining visualized paranasal sinuses, bilateral mastoid air cells, and bilateral middle ear cavities well-aerated.  Lipoma involving the subcutaneous tissues of the left frontal region with a visible protrusion.  Extensive bilateral carotid siphon and vertebral artery atherosclerosis.  IMPRESSION:  1.  No acute intracranial abnormality. 2.  Moderate to severe generalized atrophy and severe chronic microvascular ischemic changes of the white matter. 3.  Possible left orbital floor fracture.  Air-fluid level in the left maxillary sinus. 4.  Lipoma involving the subcutaneous tissues of the left frontal region.    CT CERVICAL SPINE  Findings: No fractures identified involving the cervical spine. Sagittal reconstructed images demonstrate disc space narrowing and associated endplate hypertrophic changes at every cervical level. Mild spinal stenosis at the C3-4 level and moderate spinal stenosis at the C5-6 level.  Degenerative changes at the C1-C2 articulation. Facet joints intact with severe degenerative changes throughout; degenerative changes account for ankylosis of the left C3-4 facet joint.  Coronal reformatted images demonstrate intact craniocervical junction, intact dens, and intact lateral masses. Facet and uncinate hypertrophy account for multilevel foraminal stenoses including severe left and moderate right C2-3, severe bilateral C3-4, severe right and moderate left C4-5, severe bilateral C5-6, severe bilateral C6-7, and mild to moderate right C7-T1.  IMPRESSION:  1.  No cervical spine fractures identified. 2.  Multilevel degenerative disc disease, spondylosis, and facet degenerative changes as detailed above.   Original Report Authenticated By: Hulan Saas, M.D.     Current Medications:    . aspirin EC  81 mg Oral Daily  . atorvastatin  80 mg Oral q1800  . cefTRIAXone (ROCEPHIN)  IV  1 g Intravenous Q24H  . clopidogrel  75 mg Oral Q breakfast  . feeding supplement (ENSURE COMPLETE)  237 mL Oral BID  . metoprolol succinate  12.5 mg Oral q morning - 10a  . sodium chloride  3 mL Intravenous Q12H  . sodium chloride  3 mL Intravenous Q12H   . 0.9 % NaCl with KCl 20 mEq / L 75 mL/hr at 06/05/13 2326  . heparin 950 Units/hr (06/06/13 0644)    ASSESSMENT AND PLAN:    NSTEMI (non-ST elevated myocardial infarction)    The patient's admitting symptom with syncope. He did not ever cardiac arrest. We have no documented arrhythmias. However he then developed EKG abnormalities and significant troponin elevation suggesting a significant non-ST elevation MI. Echocardiogram was done today. I reviewed and compared to his last study of  July, 2014. In July his wall motion was normal. Now he has inferior hypokinesis affecting the base of the inferior wall, mid inferior septum, and the inferolateral wall. Ejection fraction remains in the 50% range. These are new wall motion abnormalities. I had a careful discussion with the patient and his large family. This gentleman lives alone and is fully active despite his age. He is a candidate for catheterization and it will be done today. He and his family are in agreement.    HYPERLIPIDEMIA   ANEMIA, NORMOCYTIC   HYPERTENSION   PAROXYSMAL ATRIAL FIBRILLATION    Mitral valvular regurgitation     The anterior leaf of the mitral valve is thickened with some prolapse. There is a posteriorly directed mitral regurgitation jet. The MR is probably at least moderate. This has been described in the past.    Syncope    Etiology of the syncope remains unclear. There was question initially that he was orthostatic. There is also question that he has a urinary tract infection. He is receiving antibiotics. His rhythm will be monitored. Further decisions can be made  about the approach to syncope in his case later.    UTI (lower urinary tract infection)     Patient is receiving antibiotics.    Orthostasis    It was reported that he was orthostatic when he was first brought to the emergency room in any pain. Once his overall cardiac status is stable, we will have to be sure that there is not ongoing orthostasis.     Protein-calorie malnutrition, severe  Signed, Theodore Demark , PA-C Patient seen and examined. I agree with the assessment and plan as detailed above. See also my additional thoughts below.  I have composed a large part of this note. The full problem list at the bottom with the plan is written by me.  Willa Rough, MD, [facility] 06/06/2013 11:31 AM   11:03 AM 06/06/2013

## 2013-06-06 NOTE — Interval H&P Note (Signed)
History and Physical Interval Note:  06/06/2013 4:30 PM  Jeffrey [name]  has presented today for cardiac cath with the diagnosis of NSTEMI/Chest pain  The various methods of treatment have been discussed with the patient and family. After consideration of risks, benefits and other options for treatment, the patient has consented to  Procedure(s): LEFT HEART CATHETERIZATION WITH CORONARY ANGIOGRAM (N/A) as a surgical intervention .  The patient's history has been reviewed, patient examined, no change in status, stable for surgery.  I have reviewed the patient's chart and labs.  Questions were answered to the patient's satisfaction.    Cath Lab Visit (complete for each Cath Lab visit)  Clinical Evaluation Leading to the Procedure:   ACS: yes  Non-ACS:    Anginal Classification: CCS IV  Anti-ischemic medical therapy: Minimal Therapy (1 class of medications)  Non-Invasive Test Results: No non-invasive testing performed  Prior CABG: No previous CABG        Irania [name]

## 2013-06-06 NOTE — Progress Notes (Signed)
ANTICOAGULATION CONSULT NOTE - Follow Up Consult  Pharmacy Consult for Heparin Indication: chest pain/ACS/NSTEMI  No Known Allergies  Patient Measurements: Height: 6' (182.9 cm) Weight: 126 lb 1.7 oz (57.2 kg) IBW/kg (Calculated) : 77.6 Heparin Dosing Weight: 57.2 kg  Vital Signs: Temp: 97.4 F (36.3 C) (10/08 1100) Temp src: Oral (10/08 1100) BP: 131/70 mmHg (10/08 1111) Pulse Rate: 67 (10/08 1111)  Labs:  Recent Labs  06/04/13 1640 06/05/13 0526 06/05/13 0658 06/05/13 1144 06/05/13 1521 06/06/13 0640 06/06/13 1041  HGB 9.6* 8.5*  --   --   --   --  8.4*  HCT 29.0* 25.7*  --   --   --   --  25.0*  PLT 207 173  --   --   --   --  176  LABPROT  --   --   --   --   --  15.1  --   INR  --   --   --   --   --  1.22  --   HEPARINUNFRC  --   --   --   --  <0.10*  --  0.37  CREATININE 1.05 0.93  --   --   --   --   --   TROPONINI <0.30  --  18.34* >20.00*  --   --   --     Estimated Creatinine Clearance: 41 ml/min (by C-G formula based on Cr of 0.93).  Assessment:  Transferred from [facility] last night.  Heparin labs did not carry over; re-ordered this am.  Last heparin level was low on 10/7; re-bolus of 3000 units and rate increased from 700 to 950 units/hr on 10/7 ~6pm.  Heparin level is now therapeutic (0.37) on 950 units/hr. For cardiac cath today.  Plavix load given10/7 pm. Currently on ASA 81 mg and Plavix 75 mg daily.  PAF noted.  Goal of Therapy:  Heparin level 0.3-0.7 units/ml Monitor platelets by anticoagulation protocol: Yes   Plan:   Continue heparin drip at 950 units/hr.  Daily heparin level and CBC while on heparin.  Follow up post-cath.  Dennie Fetters, RPh Pager: [phone] 06/06/2013,11:47 AM

## 2013-06-06 NOTE — H&P (View-Only) (Signed)
   Patient Name: Jeffrey [name] Date of Encounter: 06/06/2013     SUBJECTIVE:    Patient is feeling well today. There are many family members in the room. He's not having any recurring chest pain.  OBJECTIVE Filed Vitals:   06/06/13 0429 06/06/13 0500 06/06/13 0600 06/06/13 0836  BP: 118/66 117/61 128/66 127/69  Pulse: 64 59 61 62  Temp: 98 F (36.7 C)   98.1 F (36.7 C)  TempSrc: Oral   Oral  Resp: 15 13 12 17  Height:      Weight: 126 lb 1.7 oz (57.2 kg)     SpO2: 95% 98% 99% 100%    Intake/Output Summary (Last 24 hours) at 06/06/13 1103 Last data filed at 06/05/13 2200  Gross per 24 hour  Intake 1818.7 ml  Output      2 ml  Net 1816.7 ml   Filed Weights   06/05/13 1859 06/05/13 2147 06/06/13 0429  Weight: 126 lb 1.7 oz (57.2 kg) 126 lb 1.7 oz (57.2 kg) 126 lb 1.7 oz (57.2 kg)   Patient is oriented to person time and place. Affect is normal. There is no jugulovenous distention. He is lying flat in bed. Lung exam does raise a question of some rales. There is no respiratory distress. Cardiac exam reveals S1 and S2 and a systolic murmur. The abdomen is soft. There is no peripheral edema.  LABS: CBC: Recent Labs  06/04/13 1640 06/05/13 0526  WBC 9.9 12.0*  NEUTROABS 8.1*  --   HGB 9.6* 8.5*  HCT 29.0* 25.7*  MCV 89.5 89.9  PLT 207 173   INR: Recent Labs  06/06/13 0640  INR 1.22   Basic Metabolic Panel: Recent Labs  06/04/13 1640 06/05/13 0526  NA 142 143  K 3.9 3.5  CL 106 107  CO2 27 28  GLUCOSE 104* 108*  BUN 23 21  CREATININE 1.05 0.93  CALCIUM 9.0 8.4  MG  --  1.9   Liver Function Tests: Recent Labs  06/04/13 1640 06/05/13 0526  AST 14 58*  ALT <5 8  ALKPHOS 55 44  BILITOT 0.3 0.5  PROT 7.2 6.2  ALBUMIN 3.3* 2.7*   Cardiac Enzymes: Recent Labs  06/04/13 1640 06/05/13 0658 06/05/13 1144  TROPONINI <0.30 18.34* >20.00*   Hemoglobin A1C: Recent Labs  06/05/13 0348  HGBA1C 6.3*   Thyroid Function Tests: Recent  Labs  06/05/13 0348  TSH 0.787    TELE:     I have reviewed telemetry today June 06, 2013. There is normal sinus rhythm.  Radiology/Studies: Dg Chest 1 View 06/04/2013   CLINICAL DATA:  Left hip pain, post fall, cough, hypertension, smoker, MRI  EXAM: CHEST - 1 VIEW  COMPARISON:  10/06/2012  FINDINGS: Enlargement of cardipneumothorax.  Osseous structures unremarkable.  IMPRESSION: Minimal enlargement of cardiac silhouette.  COPD changes.  No acute abnormalities.  ac silhouette.  Calcified mild tortuous thoracic aorta.  Pulmonary vascularity normal.  Lungs appear emphysematous but clear.  No pleural effusion or PTX  Electronically Signed   By: Mark  Boles M.D.   On: 06/04/2013 17:08   Dg Hip Complete Left 06/04/2013   CLINICAL DATA:  Left hip pain post fall  EXAM: LEFT HIP - COMPLETE 2+ VIEW  COMPARISON:  None  FINDINGS: Osseous demineralization.  Mild narrowing of the hip joints bilaterally.  No acute fracture, dislocation, or bone destruction.  Numerous pelvic phleboliths.  IMPRESSION: No acute osseous abnormalities.   Electronically Signed   By: Mark    Boles M.D.   On: 06/04/2013 17:08    Ct Cervical Spine Wo Contrast 06/04/2013   *RADIOLOGY REPORT*  Clinical Data:  Initial evaluation for this 79-year-old who had a syncopal episode earlier today and fell.  The patient is amnestic to the event.  Current complaint of headache.  CT HEAD WITHOUT CONTRAST CT CERVICAL SPINE WITHOUT CONTRAST  Technique:  Multidetector CT imaging of the head and cervical spine was performed following the standard protocol without intravenous contrast.  Multiplanar CT image reconstructions of the cervical spine were also generated.  Comparison:  None.  CT HEAD  Findings: Moderate to severe cortical and deep atrophy, moderate cerebellar atrophy, and severe changes of small vessel disease of the white matter diffusely.  No mass lesion.  No midline shift.  No acute hemorrhage or hematoma.  No extra-axial fluid collections. No  evidence of acute infarction.  Air-fluid level in the left maxillary sinus with possible fracture involving the floor of the left orbit.  No fractures elsewhere involving the skull or the visualized facial bones. Remaining visualized paranasal sinuses, bilateral mastoid air cells, and bilateral middle ear cavities well-aerated.  Lipoma involving the subcutaneous tissues of the left frontal region with a visible protrusion.  Extensive bilateral carotid siphon and vertebral artery atherosclerosis.  IMPRESSION:  1.  No acute intracranial abnormality. 2.  Moderate to severe generalized atrophy and severe chronic microvascular ischemic changes of the white matter. 3.  Possible left orbital floor fracture.  Air-fluid level in the left maxillary sinus. 4.  Lipoma involving the subcutaneous tissues of the left frontal region.    CT CERVICAL SPINE  Findings: No fractures identified involving the cervical spine. Sagittal reconstructed images demonstrate disc space narrowing and associated endplate hypertrophic changes at every cervical level. Mild spinal stenosis at the C3-4 level and moderate spinal stenosis at the C5-6 level.  Degenerative changes at the C1-C2 articulation. Facet joints intact with severe degenerative changes throughout; degenerative changes account for ankylosis of the left C3-4 facet joint.  Coronal reformatted images demonstrate intact craniocervical junction, intact dens, and intact lateral masses. Facet and uncinate hypertrophy account for multilevel foraminal stenoses including severe left and moderate right C2-3, severe bilateral C3-4, severe right and moderate left C4-5, severe bilateral C5-6, severe bilateral C6-7, and mild to moderate right C7-T1.  IMPRESSION:  1.  No cervical spine fractures identified. 2.  Multilevel degenerative disc disease, spondylosis, and facet degenerative changes as detailed above.   Original Report Authenticated By: Thomas Lawrence, M.D.     Current Medications:    . aspirin EC  81 mg Oral Daily  . atorvastatin  80 mg Oral q1800  . cefTRIAXone (ROCEPHIN)  IV  1 g Intravenous Q24H  . clopidogrel  75 mg Oral Q breakfast  . feeding supplement (ENSURE COMPLETE)  237 mL Oral BID  . metoprolol succinate  12.5 mg Oral q morning - 10a  . sodium chloride  3 mL Intravenous Q12H  . sodium chloride  3 mL Intravenous Q12H   . 0.9 % NaCl with KCl 20 mEq / L 75 mL/hr at 06/05/13 2326  . heparin 950 Units/hr (06/06/13 0644)    ASSESSMENT AND PLAN:    NSTEMI (non-ST elevated myocardial infarction)    The patient's admitting symptom with syncope. He did not ever cardiac arrest. We have no documented arrhythmias. However he then developed EKG abnormalities and significant troponin elevation suggesting a significant non-ST elevation MI. Echocardiogram was done today. I reviewed and compared to his last study of   July, 2014. In July his wall motion was normal. Now he has inferior hypokinesis affecting the base of the inferior wall, mid inferior septum, and the inferolateral wall. Ejection fraction remains in the 50% range. These are new wall motion abnormalities. I had a careful discussion with the patient and his large family. This gentleman lives alone and is fully active despite his age. He is a candidate for catheterization and it will be done today. He and his family are in agreement.    HYPERLIPIDEMIA   ANEMIA, NORMOCYTIC   HYPERTENSION   PAROXYSMAL ATRIAL FIBRILLATION    Mitral valvular regurgitation     The anterior leaf of the mitral valve is thickened with some prolapse. There is a posteriorly directed mitral regurgitation jet. The MR is probably at least moderate. This has been described in the past.    Syncope    Etiology of the syncope remains unclear. There was question initially that he was orthostatic. There is also question that he has a urinary tract infection. He is receiving antibiotics. His rhythm will be monitored. Further decisions can be made  about the approach to syncope in his case later.    UTI (lower urinary tract infection)     Patient is receiving antibiotics.    Orthostasis    It was reported that he was orthostatic when he was first brought to the emergency room in any pain. Once his overall cardiac status is stable, we will have to be sure that there is not ongoing orthostasis.     Protein-calorie malnutrition, severe  Signed, Rhonda Barrett , PA-C Patient seen and examined. I agree with the assessment and plan as detailed above. See also my additional thoughts below.  I have composed a large part of this note. The full problem list at the bottom with the plan is written by me.  Karrington Mccravy, MD, FACC 06/06/2013 11:31 AM   11:03 AM 06/06/2013  

## 2013-06-06 NOTE — Progress Notes (Signed)
  Echocardiogram 2D Echocardiogram has been performed.  Jeffrey [name] 06/06/2013, 10:12 AM

## 2013-06-07 DIAGNOSIS — R55 Syncope and collapse: Secondary | ICD-10-CM

## 2013-06-07 LAB — CBC
HCT: 22.2 % — ABNORMAL LOW (ref 39.0–52.0)
Hemoglobin: 7.6 g/dL — ABNORMAL LOW (ref 13.0–17.0)
MCH: 30.2 pg (ref 26.0–34.0)
MCHC: 34.2 g/dL (ref 30.0–36.0)
MCV: 88.1 fL (ref 78.0–100.0)
Platelets: 163 10*3/uL (ref 150–400)
RBC: 2.52 MIL/uL — ABNORMAL LOW (ref 4.22–5.81)
RDW: 13.9 % (ref 11.5–15.5)
WBC: 5.8 10*3/uL (ref 4.0–10.5)

## 2013-06-07 LAB — PREPARE RBC (CROSSMATCH)

## 2013-06-07 LAB — URINE CULTURE: Colony Count: >=100000

## 2013-06-07 MED ORDER — PANTOPRAZOLE SODIUM 40 MG PO TBEC
40.0000 mg | DELAYED_RELEASE_TABLET | Freq: Every day | ORAL | Status: DC
  Administered 2013-06-07 – 2013-06-08 (×2): 40 mg via ORAL
  Filled 2013-06-07 (×2): qty 1

## 2013-06-07 MED ORDER — FUROSEMIDE 10 MG/ML IJ SOLN
20.0000 mg | Freq: Once | INTRAMUSCULAR | Status: AC
  Administered 2013-06-07: 20 mg via INTRAVENOUS
  Filled 2013-06-07 (×2): qty 2

## 2013-06-07 NOTE — Progress Notes (Signed)
Patient Name: Jeffrey [name] Date of Encounter: 06/07/2013     SUBJECTIVE: No chest pain or dyspnea  OBJECTIVE Filed Vitals:   06/06/13 1605 06/06/13 1900 06/06/13 1953 06/07/13 0410  BP:  130/75 136/82 129/73  Pulse: 73 72 97 97  Temp:   98.3 F (36.8 C)   TempSrc:      Resp:      Height:      Weight:    135 lb 3.2 oz (61.326 kg)  SpO2:   97% 96%    Intake/Output Summary (Last 24 hours) at 06/07/13 0718 Last data filed at 06/07/13 0200  Gross per 24 hour  Intake  425.5 ml  Output    200 ml  Net  225.5 ml   Filed Weights   06/05/13 2147 06/06/13 0429 06/07/13 0410  Weight: 126 lb 1.7 oz (57.2 kg) 126 lb 1.7 oz (57.2 kg) 135 lb 3.2 oz (61.326 kg)   Patient is oriented to person time and place. Affect is normal. HEENT normal. Neck supple. There is no jugulovenous distention. Chest CTA. There is no respiratory distress. Cardiac exam reveals S1 and S2 and a 2/6 systolic murmur apex. The abdomen is soft. Right groin with no hematoma and no bruit. There is no peripheral edema. Neuro - grossly intact  LABS: CBC: Recent Labs  06/04/13 1640  06/06/13 1041 06/07/13 0545  WBC 9.9  < > 7.1 5.8  NEUTROABS 8.1*  --   --   --   HGB 9.6*  < > 8.4* 7.6*  HCT 29.0*  < > 25.0* 22.2*  MCV 89.5  < > 87.4 88.1  PLT 207  < > 176 163  < > = values in this interval not displayed. INR:  Recent Labs  06/06/13 0640  INR 1.22   Basic Metabolic Panel:  Recent Labs  91/47/82 1640 06/05/13 0526  NA 142 143  K 3.9 3.5  CL 106 107  CO2 27 28  GLUCOSE 104* 108*  BUN 23 21  CREATININE 1.05 0.93  CALCIUM 9.0 8.4  MG  --  1.9   Liver Function Tests:  Recent Labs  06/04/13 1640 06/05/13 0526  AST 14 58*  ALT <5 8  ALKPHOS 55 44  BILITOT 0.3 0.5  PROT 7.2 6.2  ALBUMIN 3.3* 2.7*   Cardiac Enzymes:  Recent Labs  06/04/13 1640 06/05/13 0658 06/05/13 1144  TROPONINI <0.30 18.34* >20.00*   Hemoglobin A1C:  Recent Labs  06/05/13 0348  HGBA1C 6.3*    Thyroid Function Tests:  Recent Labs  06/05/13 0348  TSH 0.787    TELE:     I have reviewed telemetry today June 06, 2013. There is normal sinus rhythm.  Radiology/Studies: Dg Chest 1 View 06/04/2013   CLINICAL DATA:  Left hip pain, post fall, cough, hypertension, smoker, MRI  EXAM: CHEST - 1 VIEW  COMPARISON:  10/06/2012  FINDINGS: Enlargement of cardipneumothorax.  Osseous structures unremarkable.  IMPRESSION: Minimal enlargement of cardiac silhouette.  COPD changes.  No acute abnormalities.  ac silhouette.  Calcified mild tortuous thoracic aorta.  Pulmonary vascularity normal.  Lungs appear emphysematous but clear.  No pleural effusion or PTX  Electronically Signed   By: Ulyses Southward M.D.   On: 06/04/2013 17:08   Dg Hip Complete Left 06/04/2013   CLINICAL DATA:  Left hip pain post fall  EXAM: LEFT HIP - COMPLETE 2+ VIEW  COMPARISON:  None  FINDINGS: Osseous demineralization.  Mild narrowing of the hip joints  bilaterally.  No acute fracture, dislocation, or bone destruction.  Numerous pelvic phleboliths.  IMPRESSION: No acute osseous abnormalities.   Electronically Signed   By: Ulyses Southward M.D.   On: 06/04/2013 17:08    Ct Cervical Spine Wo Contrast 06/04/2013   *RADIOLOGY REPORT*  Clinical Data:  Initial evaluation for this 80 year old who had a syncopal episode earlier today and fell.  The patient is amnestic to the event.  Current complaint of headache.  CT HEAD WITHOUT CONTRAST CT CERVICAL SPINE WITHOUT CONTRAST  Technique:  Multidetector CT imaging of the head and cervical spine was performed following the standard protocol without intravenous contrast.  Multiplanar CT image reconstructions of the cervical spine were also generated.  Comparison:  None.  CT HEAD  Findings: Moderate to severe cortical and deep atrophy, moderate cerebellar atrophy, and severe changes of small vessel disease of the white matter diffusely.  No mass lesion.  No midline shift.  No acute hemorrhage or hematoma.   No extra-axial fluid collections. No evidence of acute infarction.  Air-fluid level in the left maxillary sinus with possible fracture involving the floor of the left orbit.  No fractures elsewhere involving the skull or the visualized facial bones. Remaining visualized paranasal sinuses, bilateral mastoid air cells, and bilateral middle ear cavities well-aerated.  Lipoma involving the subcutaneous tissues of the left frontal region with a visible protrusion.  Extensive bilateral carotid siphon and vertebral artery atherosclerosis.  IMPRESSION:  1.  No acute intracranial abnormality. 2.  Moderate to severe generalized atrophy and severe chronic microvascular ischemic changes of the white matter. 3.  Possible left orbital floor fracture.  Air-fluid level in the left maxillary sinus. 4.  Lipoma involving the subcutaneous tissues of the left frontal region.    CT CERVICAL SPINE  Findings: No fractures identified involving the cervical spine. Sagittal reconstructed images demonstrate disc space narrowing and associated endplate hypertrophic changes at every cervical level. Mild spinal stenosis at the C3-4 level and moderate spinal stenosis at the C5-6 level.  Degenerative changes at the C1-C2 articulation. Facet joints intact with severe degenerative changes throughout; degenerative changes account for ankylosis of the left C3-4 facet joint.  Coronal reformatted images demonstrate intact craniocervical junction, intact dens, and intact lateral masses. Facet and uncinate hypertrophy account for multilevel foraminal stenoses including severe left and moderate right C2-3, severe bilateral C3-4, severe right and moderate left C4-5, severe bilateral C5-6, severe bilateral C6-7, and mild to moderate right C7-T1.  IMPRESSION:  1.  No cervical spine fractures identified. 2.  Multilevel degenerative disc disease, spondylosis, and facet degenerative changes as detailed above.   Original Report Authenticated By: Hulan Saas,  M.D.     Current Medications:  . aspirin EC  81 mg Oral Daily  . atorvastatin  80 mg Oral q1800  . cefTRIAXone (ROCEPHIN)  IV  1 g Intravenous Q24H  . clopidogrel  75 mg Oral Q breakfast  . feeding supplement (ENSURE COMPLETE)  237 mL Oral BID  . metoprolol succinate  12.5 mg Oral q morning - 10a  . sodium chloride  3 mL Intravenous Q12H      ASSESSMENT AND PLAN:    NSTEMI (non-ST elevated myocardial infarction) Cath results noted. Severe three-vessel coronary disease including left main disease. Given age coronary artery bypassing graft not a good option. Plan medical therapy. Continue aspirin, Plavix, statin and metoprolol.    Mitral valvular regurgitation The anterior leaf of the mitral valve is thickened with some prolapse. There is a posteriorly directed  mitral regurgitation jet. The MR is probably at least moderate. This has been described in the past.    Syncope Telemetry with sinus rhythm. Question related to infarct.    UTI (lower urinary tract infection)   Continue antibiotics      Protein-calorie malnutrition, severe    Acute on chronic Normocytic anemia-patient's hemoglobin has dropped. There is no evidence of active bleeding. No hematoma at cath site. Plan transfuse 2 units of packed red blood cells given recent infarct. Repeat hemoglobin in a.m. Problem discharge tomorrow morning if stable.  Signed, Olga Millers, MD, [facility] 06/07/2013 7:18 AM   7:18 AM 06/07/2013

## 2013-06-08 ENCOUNTER — Telehealth: Payer: Self-pay | Admitting: *Deleted

## 2013-06-08 ENCOUNTER — Encounter (HOSPITAL_COMMUNITY): Payer: Self-pay | Admitting: Physician Assistant

## 2013-06-08 DIAGNOSIS — I951 Orthostatic hypotension: Secondary | ICD-10-CM

## 2013-06-08 DIAGNOSIS — H0530 Unspecified deformity of orbit: Secondary | ICD-10-CM

## 2013-06-08 LAB — TYPE AND SCREEN
Antibody Screen: POSITIVE
Donor AG Type: NEGATIVE
Donor AG Type: NEGATIVE
PT AG Type: NEGATIVE
Unit division: 0

## 2013-06-08 LAB — POCT ACTIVATED CLOTTING TIME: Activated Clotting Time: 211 seconds

## 2013-06-08 LAB — BASIC METABOLIC PANEL
CO2: 25 mEq/L (ref 19–32)
GFR calc non Af Amer: 73 mL/min — ABNORMAL LOW (ref >90)
Glucose, Bld: 110 mg/dL — ABNORMAL HIGH (ref 70–99)
Potassium: 3.8 mEq/L (ref 3.5–5.1)
Sodium: 137 mEq/L (ref 135–145)

## 2013-06-08 LAB — CBC
HCT: 29.5 % — ABNORMAL LOW (ref 39.0–52.0)
Hemoglobin: 10.2 g/dL — ABNORMAL LOW (ref 13.0–17.0)
MCH: 29.9 pg (ref 26.0–34.0)
Platelets: 173 10*3/uL (ref 150–400)
RDW: 14.6 % (ref 11.5–15.5)
WBC: 7.9 10*3/uL (ref 4.0–10.5)

## 2013-06-08 MED ORDER — CLOPIDOGREL BISULFATE 75 MG PO TABS: 75.0000 mg | ORAL_TABLET | Freq: Every day | ORAL | Status: DC

## 2013-06-08 MED ORDER — METOPROLOL SUCCINATE ER 25 MG PO TB24: 12.5000 mg | ORAL_TABLET | Freq: Every morning | ORAL | Status: DC

## 2013-06-08 MED ORDER — ATORVASTATIN CALCIUM 40 MG PO TABS: 40.0000 mg | ORAL_TABLET | Freq: Every evening | ORAL | Status: DC

## 2013-06-08 MED ORDER — NITROGLYCERIN 0.4 MG SL SUBL: 0.4000 mg | SUBLINGUAL_TABLET | SUBLINGUAL | Status: DC | PRN

## 2013-06-08 MED ORDER — AMPICILLIN 250 MG PO CAPS: 250.0000 mg | ORAL_CAPSULE | Freq: Three times a day (TID) | ORAL | Status: DC

## 2013-06-08 MED ORDER — PANTOPRAZOLE SODIUM 40 MG PO TBEC: 40.0000 mg | DELAYED_RELEASE_TABLET | Freq: Every day | ORAL | Status: DC

## 2013-06-08 NOTE — Progress Notes (Signed)
Pt stable for discharge, d/c'd with family to private vehicle via wheelchair

## 2013-06-08 NOTE — Progress Notes (Signed)
Will order home health PT based on PT recommendations. Dayna Dunn PA-C

## 2013-06-08 NOTE — Telephone Encounter (Signed)
tcm pt being discharged today

## 2013-06-08 NOTE — Discharge Summary (Signed)
See progress notes Michelle Wnek  

## 2013-06-08 NOTE — Discharge Summary (Signed)
Discharge Summary   Patient ID: Jeffrey [name] MRN: [id number], DOB/AGE: 1918/04/26 79 y.o. Admit date: 06/04/2013 D/C date:     06/08/2013  Primary Cardiologist: Purvis Sheffield in [place]  Primary Discharge Diagnoses:  1. NSTEMI/severe 3-vessel CAD including LM disease 2. Mitral valve regurgitation 3. Syncope 4. NSVT 5. E Coli UTI 6. Severe protein-calorie malnutrition 7. Acute on chronic normocytic anemia s/p transfusion 8. Orthostatic hypotension 9. Abnormal orbital floor appearance on CT - felt due to sinusitis by radiologist review  Secondary Discharge Diagnoses:  1. Paroxysmal afib 2. COPD 3. HL 4. PAD  Hospital Course: Jeffrey [name] is a 79 y/o M with history of paroxysmal afib, COPD, HL, PAD, and CAD (by [facility]: multivessel disease 2010 with NSTEMI felt secondary to AF-RVR in setting of CAD). He presented to [facility]/04/2013 with a possible syncopal episode. He was standing in line at the bank and felt acutely weak. He fell on his left side. It is unclear if he lost consciousness. He denied any CP, palpitations, or SOB. He was found to be orthostatic in the ER (BP 147/66 lying -> 84/41 sitting), was started on IVF and admitted to the medicine floor. Metoprolol dose was decreased. His initial troponin was negative, however increased to 18 and then greater than 20. EKG showed EKG shows ST elevation in AVR with diffuse ST depressions suggestive of left main disease or severe 3 vessel disease. He was started on IV heparin. Based on high risk features, patient was transferred to [facility] for further evaluation and management. The patient expressed willingness to have invasive procedures done. 2D echo showed EF 50% with new WMA described below, posteriorly directed moderate MR with probable mild prolapse of anterior leaflet, PA pressure . Cardiac cath was performed on 06/06/13 demonstrated severe triple vessel CAD and left main disease. PCI was not an option. He would best be treated by CABG  but given advanced age he would likely not do well with this major surgery. He was previously deemed not a surgical candidate back in 2010. Medical management was recommended. Plavix was initiated. It was felt that perhaps his syncopal spell was related to his infarct (vs orthostasis as noted in ED). He had brief NSVT on telemetry but no sustained arrhythmias. Post procedure it was noted that his Hgb had dropped (to 7.6 - was 9.6 on adm). There was no evidence of active bleeding. He was transfused 2 units given recent infarct. His Hgb improved to 10.2 today. He was instructed to f/u PCP for this. He is feeling well today. Dr. Jens Som has seen and examined the patient today and feels she is stable for discharge.  Two other issues to note:  - This admission, the patient also had an E coli UTI with fever, sensitive to all except cipro/levofloxacin. He was given rocephin in the hospital then changed to ampicillin to complete 7 day total antibiotics. - Dr. Irene Limbo with IM reviewed CT findings with Dr. [name] given question of orbital floor fracture. They felt the abnormal appearance of the orbital floor was likely related to sinusitis (for which he was asymptomatic).   Discharge Vitals: Blood pressure 107/50, pulse 69, temperature 97.8 F (36.6 C), temperature source Oral, resp. rate 16, height 5\' 8"  (1.727 m), weight 136 lb 11 oz (62 kg), SpO2 96.00%.  Labs: Lab Results  Component Value Date   WBC 7.9 06/08/2013   HGB 10.2* 06/08/2013   HCT 29.5* 06/08/2013   MCV 86.5 06/08/2013   PLT 173 06/08/2013  Recent Labs Lab 06/05/13 0526 06/08/13 0605  NA 143 137  K 3.5 3.8  CL 107 103  CO2 28 25  BUN 21 19  CREATININE 0.93 0.86  CALCIUM 8.4 8.4  PROT 6.2  --   BILITOT 0.5  --   ALKPHOS 44  --   ALT 8  --   AST 58*  --   GLUCOSE 108* 110*    Recent Labs  06/05/13 1144  TROPONINI >20.00*   Lab Results  Component Value Date   CHOL 140 01/16/2009   HDL 44 01/16/2009   LDLCALC 79  01/16/2009   TRIG 87 01/16/2009    Diagnostic Studies/Procedures   Dg Chest 1 View  06/04/2013   CLINICAL DATA:  Left hip pain, post fall, cough, hypertension, smoker, MRI  EXAM: CHEST - 1 VIEW  COMPARISON:  10/06/2012  FINDINGS: Enlargement of cardiac silhouette.  Calcified mild tortuous thoracic aorta.  Pulmonary vascularity normal.  Lungs appear emphysematous but clear.  No pleural effusion or pneumothorax.  Osseous structures unremarkable.  IMPRESSION: Minimal enlargement of cardiac silhouette.  COPD changes.  No acute abnormalities.   Electronically Signed   By: Ulyses Southward M.D.   On: 06/04/2013 17:08   Dg Hip Complete Left 06/04/2013   CLINICAL DATA:  Left hip pain post fall  EXAM: LEFT HIP - COMPLETE 2+ VIEW  COMPARISON:  None  FINDINGS: Osseous demineralization.  Mild narrowing of the hip joints bilaterally.  No acute fracture, dislocation, or bone destruction.  Numerous pelvic phleboliths.  IMPRESSION: No acute osseous abnormalities.   Electronically Signed   By: Ulyses Southward M.D.   On: 06/04/2013 17:08   Ct Head Wo Contrast 06/04/2013   *RADIOLOGY REPORT*  Clinical Data:  Initial evaluation for this 79 year old who had a syncopal episode earlier today and fell.  The patient is amnestic to the event.  Current complaint of headache.  CT HEAD WITHOUT CONTRAST CT CERVICAL SPINE WITHOUT CONTRAST  Technique:  Multidetector CT imaging of the head and cervical spine was performed following the standard protocol without intravenous contrast.  Multiplanar CT image reconstructions of the cervical spine were also generated.  Comparison:  None.  CT HEAD  Findings: Moderate to severe cortical and deep atrophy, moderate cerebellar atrophy, and severe changes of small vessel disease of the white matter diffusely.  No mass lesion.  No midline shift.  No acute hemorrhage or hematoma.  No extra-axial fluid collections. No evidence of acute infarction.  Air-fluid level in the left maxillary sinus with possible fracture  involving the floor of the left orbit.  No fractures elsewhere involving the skull or the visualized facial bones. Remaining visualized paranasal sinuses, bilateral mastoid air cells, and bilateral middle ear cavities well-aerated.  Lipoma involving the subcutaneous tissues of the left frontal region with a visible protrusion.  Extensive bilateral carotid siphon and vertebral artery atherosclerosis.  IMPRESSION:  1.  No acute intracranial abnormality. 2.  Moderate to severe generalized atrophy and severe chronic microvascular ischemic changes of the white matter. 3.  Possible left orbital floor fracture.  Air-fluid level in the left maxillary sinus. 4.  Lipoma involving the subcutaneous tissues of the left frontal region.  CT CERVICAL SPINE  Findings: No fractures identified involving the cervical spine. Sagittal reconstructed images demonstrate disc space narrowing and associated endplate hypertrophic changes at every cervical level. Mild spinal stenosis at the C3-4 level and moderate spinal stenosis at the C5-6 level.  Degenerative changes at the C1-C2 articulation. Facet joints intact with  severe degenerative changes throughout; degenerative changes account for ankylosis of the left C3-4 facet joint.  Coronal reformatted images demonstrate intact craniocervical junction, intact dens, and intact lateral masses. Facet and uncinate hypertrophy account for multilevel foraminal stenoses including severe left and moderate right C2-3, severe bilateral C3-4, severe right and moderate left C4-5, severe bilateral C5-6, severe bilateral C6-7, and mild to moderate right C7-T1.  IMPRESSION:  1.  No cervical spine fractures identified. 2.  Multilevel degenerative disc disease, spondylosis, and facet degenerative changes as detailed above.   Original Report Authenticated By: Hulan Saas, M.D.   Ct Cervical Spine Wo Contrast 06/04/2013   *RADIOLOGY REPORT*  Clinical Data:  Initial evaluation for this 79 year old who had a  syncopal episode earlier today and fell.  The patient is amnestic to the event.  Current complaint of headache.  CT HEAD WITHOUT CONTRAST CT CERVICAL SPINE WITHOUT CONTRAST  Technique:  Multidetector CT imaging of the head and cervical spine was performed following the standard protocol without intravenous contrast.  Multiplanar CT image reconstructions of the cervical spine were also generated.  Comparison:  None.  CT HEAD  Findings: Moderate to severe cortical and deep atrophy, moderate cerebellar atrophy, and severe changes of small vessel disease of the white matter diffusely.  No mass lesion.  No midline shift.  No acute hemorrhage or hematoma.  No extra-axial fluid collections. No evidence of acute infarction.  Air-fluid level in the left maxillary sinus with possible fracture involving the floor of the left orbit.  No fractures elsewhere involving the skull or the visualized facial bones. Remaining visualized paranasal sinuses, bilateral mastoid air cells, and bilateral middle ear cavities well-aerated.  Lipoma involving the subcutaneous tissues of the left frontal region with a visible protrusion.  Extensive bilateral carotid siphon and vertebral artery atherosclerosis.  IMPRESSION:  1.  No acute intracranial abnormality. 2.  Moderate to severe generalized atrophy and severe chronic microvascular ischemic changes of the white matter. 3.  Possible left orbital floor fracture.  Air-fluid level in the left maxillary sinus. 4.  Lipoma involving the subcutaneous tissues of the left frontal region.  CT CERVICAL SPINE  Findings: No fractures identified involving the cervical spine. Sagittal reconstructed images demonstrate disc space narrowing and associated endplate hypertrophic changes at every cervical level. Mild spinal stenosis at the C3-4 level and moderate spinal stenosis at the C5-6 level.  Degenerative changes at the C1-C2 articulation. Facet joints intact with severe degenerative changes throughout;  degenerative changes account for ankylosis of the left C3-4 facet joint.  Coronal reformatted images demonstrate intact craniocervical junction, intact dens, and intact lateral masses. Facet and uncinate hypertrophy account for multilevel foraminal stenoses including severe left and moderate right C2-3, severe bilateral C3-4, severe right and moderate left C4-5, severe bilateral C5-6, severe bilateral C6-7, and mild to moderate right C7-T1.  IMPRESSION:  1.  No cervical spine fractures identified. 2.  Multilevel degenerative disc disease, spondylosis, and facet degenerative changes as detailed above.   Original Report Authenticated By: Hulan Saas, M.D.   2D Echo 06/06/13 - Left ventricle: Hypokinesis of the inferolateral wall and hypokinesis of the base inferior and mid-inferoseptal. The cavity size was normal. Wall thickness was increased in a pattern of mild LVH. The estimated ejection fraction was 50%. Findings consistent with left ventricular diastolic dysfunction. - Mitral valve: Anterior mitral valve thickening. Probable mild prolapse of anterior leaflet. Posteriorly directed moderate MR. - Left atrium: The atrium was moderately dilated. - Pulmonary arteries: PA peak pressure: 69mm Hg (S). -  Impressions: THE WALL MOTION ABNORMALITIES ARE NEW SINCE 02/2013. THE MR IS OLD.  Cardiac Cath 06/06/13 Procedure Performed:  1. Left Heart Catheterization 2. Selective Coronary Angiography 3. Left ventricular pressures Operator: Verne Carrow, MD  Arterial access site: Right radial artery.  Indication: 79 yo male with history of severe CAD with last cath in 2010 per [facility] with severe distal Left main stenosis, severe LAD/Circumflex disease, occluded RCA. He is now admitted after a syncopal episode. He has ruled in for an MI with troponin >20.  Procedure Details:  The risks, benefits, complications, treatment options, and expected outcomes were discussed with the patient. The patient  and/or family concurred with the proposed plan, giving informed consent. The patient was brought to the cath lab after IV hydration was begun and oral premedication was given. The right wrist was assessed with an Allens test which was positive. The right wrist was prepped and draped in a sterile fashion. 1% lidocaine was used for local anesthesia. Using the modified Seldinger access technique, a 5 French sheath was placed in the right radial artery. 3 mg Verapamil was given through the sheath. 3000 units IV heparin was given. The pt had very tortuous vessels and it was very difficult to pass the catheter into the aortic arch. I was able to advance the catheter into the ascending aorta but I could not engage the RCA due to inability to torque the catheter. I elected to gain access into the right femoral artery. A 5 French sheath was placed in there right femoral artery. Standard diagnostic catheters were used to perform selective coronary angiography. A pigtail catheter was used to measure LV pressures. The sheath was removed from the right radial artery and a Terumo hemostasis band was applied at the arteriotomy site on the right wrist.  There were no immediate complications. The patient was taken to the recovery area in stable condition.  Hemodynamic Findings:  Central aortic pressure: 132/64  Left ventricular pressure: 133/9/20  Angiographic Findings:  Left main: 70-80% distal stenosis.  Left Anterior Descending Artery: Large caliber vessel that courses to the apex. The ostium has a 80% stenosis. The entire proximal and mid vessel is heavily calcified. There is diffuse 70% stenosis throughout the proximal segment. The mid vessel has diffuse 40% stenosis. There is a patent stent in the mid vessel without restenosis. There are several small diagonal branches that have mild plaque disease.  Circumflex Artery: Large caliber vessel with ostial 60% stenosis, 50% proximal stenosis. There is a moderate caliber  obtuse marginal branch with ostial 60% stenosis and mid 90% stenosis.  Right Coronary Artery: Large dominant vessel with diffuse 70% proximal stenosis, 100% mid stenosis. There are left to right collaterals.  Left Ventricular Angiogram: Deferred.  Impression:  1. Severe triple vessel CAD, left main disease.  2. NSTEMI with multiple potential areas of ischemia.  Recommendations: He has severe left main disease and diffuse disease in all major epicardial vessels. PCI is not an option. He would best be treated with CABG but given advanced age, he would likely not do well with CABG. He was deemed to not be a surgical candidate in 2010. He is now 79 years old. I would favor continued medical management.  Complications: None. The patient tolerated the procedure well.    Discharge Medications     Medication List         ampicillin 250 MG capsule  Commonly known as:  PRINCIPEN  Take 1 capsule (250 mg total) by mouth 3 (three)  times daily.     aspirin 81 MG tablet  Take 81 mg by mouth every morning.     atorvastatin 40 MG tablet  Commonly known as:  LIPITOR  Take 1 tablet (40 mg total) by mouth every evening.     clopidogrel 75 MG tablet  Commonly known as:  PLAVIX  Take 1 tablet (75 mg total) by mouth daily.     metoprolol succinate 25 MG 24 hr tablet  Commonly known as:  TOPROL-XL  Take 0.5 tablets (12.5 mg total) by mouth every morning.     nitroGLYCERIN 0.4 MG SL tablet  Commonly known as:  NITROSTAT  Place 1 tablet (0.4 mg total) under the tongue every 5 (five) minutes as needed for chest pain (up to 3 doses).     pantoprazole 40 MG tablet  Commonly known as:  PROTONIX  Take 1 tablet (40 mg total) by mouth daily.        Disposition   The patient will be discharged in stable condition to home. Discharge Orders   Future Appointments Provider Department Dept Phone   06/22/2013 9:00 AM Laqueta Linden, MD [facility] [phone]   08/27/2013 10:30 AM  Dorena Bodo, PA-C [facility] [phone]   Future Orders Complete By Expires   Diet - low sodium heart healthy  As directed    Increase activity slowly  As directed    Comments:     No driving until cleared by your heart doctor. No lifting over 10 lbs for 2 weeks. No sexual activity for 2 weeks. Keep procedure site clean & dry. If you notice increased pain, swelling, bleeding or pus, call/return!  You may shower, but no soaking baths/hot tubs/pools for 1 week.     Follow-up Information   Follow up with [facility] BETH, PA-C. (Please call your primary care provider today to discuss evaluation of your anemia since you required a blood transfusion while in the hospital. )    Specialty:  Physician Assistant   Contact information:   [address] [place] [address] [phone]       Follow up with Laqueta Linden, MD. [facility] [place] office - 06/22/13 at 9am)    Specialty:  Cardiology   Contact information:   [address] [place] [address] [phone]         Duration of Discharge Encounter: Greater than 30 minutes including physician and PA time.  Signed, Ronie Spies PA-C 06/08/2013, 9:39 AM

## 2013-06-08 NOTE — Evaluation (Signed)
Physical Therapy Evaluation Patient Details Name: Jeffrey [name] MRN: [id number] DOB: 1918/06/19 Today's Date: 06/08/2013 Time: 1208-1230 PT Time Calculation (min): 22 min  PT Assessment / Plan / Recommendation History of Present Illness  Pt initially adm to [facility] with syncope.  Transferred to [facility] with NSTEMI and cardiac cath revealed severe 3 vessel CAD.  Clinical Impression  Pt to dc home today with friend to provide needed assist at home.  Can benefit from HHPT to incr independence and safety.    PT Assessment  All further PT needs can be met in the next venue of care    Follow Up Recommendations  Home health PT;Supervision/Assistance - 24 hour    Does the patient have the potential to tolerate intense rehabilitation      Barriers to Discharge        Equipment Recommendations  None recommended by PT    Recommendations for Other Services     Frequency      Precautions / Restrictions Precautions Precautions: Fall   Pertinent Vitals/Pain Reports lt leg soreness due to fall at time of syncope.      Mobility  Transfers Transfers: Stand to Sit Sit to Stand: 5: Supervision;With upper extremity assist;From bed;From toilet Stand to Sit: 5: Supervision;To bed;To toilet Ambulation/Gait Ambulation/Gait Assistance: 5: Supervision Ambulation Distance (Feet): 175 Feet Assistive device: Rolling walker Ambulation/Gait Assistance Details: Verbal cues initially to stay closer to walker. Gait Pattern: Step-through pattern;Decreased stride length;Narrow base of support    Exercises     PT Diagnosis: Generalized weakness;Difficulty walking  PT Problem List: Decreased strength;Decreased balance;Decreased mobility;Decreased knowledge of use of DME PT Treatment Interventions:       PT Goals(Current goals can be found in the care plan section) Acute Rehab PT Goals Patient Stated Goal: Return home  Visit Information  Last PT Received On: 06/08/13 Assistance Needed:  +1 History of Present Illness: Pt initially adm to [facility] with syncope.  Transferred to [facility] with NSTEMI and cardiac cath revealed severe 3 vessel CAD.       Prior Functioning  Home Living Family/patient expects to be discharged to:: Private residence Living Arrangements: Alone Available Help at Discharge: Friend(s) (significant other will stay 24/7 initially) Type of Home: Apartment Home Access: Level entry Home Layout: One level Home Equipment: Walker - 2 wheels;Cane - single point Additional Comments: Family has rolling walker he can use. Prior Function Level of Independence: Independent Communication Communication: No difficulties Dominant Hand: Right    Cognition  Cognition Arousal/Alertness: Awake/alert Behavior During Therapy: WFL for tasks assessed/performed Overall Cognitive Status: Within Functional Limits for tasks assessed    Extremity/Trunk Assessment Upper Extremity Assessment Upper Extremity Assessment: Generalized weakness Lower Extremity Assessment Lower Extremity Assessment: Generalized weakness   Balance Balance Balance Assessed: Yes Static Standing Balance Static Standing - Balance Support: No upper extremity supported;During functional activity Static Standing - Level of Assistance: 5: Stand by assistance  End of Session PT - End of Session Equipment Utilized During Treatment: Gait belt Activity Tolerance: Patient tolerated treatment well Patient left: in chair;with family/visitor present Nurse Communication: Mobility status  GP     Jeffrey [name] 06/08/2013, 12:50 PM  [employer] PT [phone]

## 2013-06-08 NOTE — Progress Notes (Signed)
Patient Name: Jeffrey [name] Date of Encounter: 06/08/2013     SUBJECTIVE: No chest pain or dyspnea  OBJECTIVE Filed Vitals:   06/08/13 0120 06/08/13 0142 06/08/13 0220 06/08/13 0430  BP: 160/81 162/84 164/84 152/73  Pulse: 76 75 84 68  Temp: 97.8 F (36.6 C) 98.5 F (36.9 C) 97.6 F (36.4 C) 97.7 F (36.5 C)  TempSrc: Oral Oral Oral Oral  Resp: 18 18 16 18   Height:      Weight:    136 lb 11 oz (62 kg)  SpO2: 99% 97% 98% 99%    Intake/Output Summary (Last 24 hours) at 06/08/13 0729 Last data filed at 06/08/13 0220  Gross per 24 hour  Intake    615 ml  Output   1050 ml  Net   -435 ml   Filed Weights   06/06/13 0429 06/07/13 0410 06/08/13 0430  Weight: 126 lb 1.7 oz (57.2 kg) 135 lb 3.2 oz (61.326 kg) 136 lb 11 oz (62 kg)   Patient is oriented to person time and place. Affect is normal. HEENT normal. Neck supple. There is no jugulovenous distention. Chest CTA. There is no respiratory distress. Cardiac exam reveals S1 and S2 and a 2/6 systolic murmur apex. The abdomen is soft. Right groin with no hematoma and no bruit. There is no peripheral edema. Neuro - grossly intact  LABS: CBC:  Recent Labs  06/07/13 0545 06/08/13 0605  WBC 5.8 7.9  HGB 7.6* 10.2*  HCT 22.2* 29.5*  MCV 88.1 86.5  PLT 163 173   INR:  Recent Labs  06/06/13 0640  INR 1.22   Cardiac Enzymes:  Recent Labs  06/05/13 1144  TROPONINI >20.00*   Hemoglobin A1C: No results found for this basename: HGBA1C,  in the last 72 hours Thyroid Function Tests: No results found for this basename: TSH, T4TOTAL, FREET3, T3FREE, THYROIDAB,  in the last 72 hours  TELE:    Sinus with NSVT  Radiology/Studies: Dg Chest 1 View 06/04/2013   CLINICAL DATA:  Left hip pain, post fall, cough, hypertension, smoker, MRI  EXAM: CHEST - 1 VIEW  COMPARISON:  10/06/2012  FINDINGS: Enlargement of cardipneumothorax.  Osseous structures unremarkable.  IMPRESSION: Minimal enlargement of cardiac silhouette.   COPD changes.  No acute abnormalities.  ac silhouette.  Calcified mild tortuous thoracic aorta.  Pulmonary vascularity normal.  Lungs appear emphysematous but clear.  No pleural effusion or PTX  Electronically Signed   By: Ulyses Southward M.D.   On: 06/04/2013 17:08   Dg Hip Complete Left 06/04/2013   CLINICAL DATA:  Left hip pain post fall  EXAM: LEFT HIP - COMPLETE 2+ VIEW  COMPARISON:  None  FINDINGS: Osseous demineralization.  Mild narrowing of the hip joints bilaterally.  No acute fracture, dislocation, or bone destruction.  Numerous pelvic phleboliths.  IMPRESSION: No acute osseous abnormalities.   Electronically Signed   By: Ulyses Southward M.D.   On: 06/04/2013 17:08    Ct Cervical Spine Wo Contrast 06/04/2013   *RADIOLOGY REPORT*  Clinical Data:  Initial evaluation for this 79 year old who had a syncopal episode earlier today and fell.  The patient is amnestic to the event.  Current complaint of headache.  CT HEAD WITHOUT CONTRAST CT CERVICAL SPINE WITHOUT CONTRAST  Technique:  Multidetector CT imaging of the head and cervical spine was performed following the standard protocol without intravenous contrast.  Multiplanar CT image reconstructions of the cervical spine were also generated.  Comparison:  None.  CT  HEAD  Findings: Moderate to severe cortical and deep atrophy, moderate cerebellar atrophy, and severe changes of small vessel disease of the white matter diffusely.  No mass lesion.  No midline shift.  No acute hemorrhage or hematoma.  No extra-axial fluid collections. No evidence of acute infarction.  Air-fluid level in the left maxillary sinus with possible fracture involving the floor of the left orbit.  No fractures elsewhere involving the skull or the visualized facial bones. Remaining visualized paranasal sinuses, bilateral mastoid air cells, and bilateral middle ear cavities well-aerated.  Lipoma involving the subcutaneous tissues of the left frontal region with a visible protrusion.  Extensive  bilateral carotid siphon and vertebral artery atherosclerosis.  IMPRESSION:  1.  No acute intracranial abnormality. 2.  Moderate to severe generalized atrophy and severe chronic microvascular ischemic changes of the white matter. 3.  Possible left orbital floor fracture.  Air-fluid level in the left maxillary sinus. 4.  Lipoma involving the subcutaneous tissues of the left frontal region.    CT CERVICAL SPINE  Findings: No fractures identified involving the cervical spine. Sagittal reconstructed images demonstrate disc space narrowing and associated endplate hypertrophic changes at every cervical level. Mild spinal stenosis at the C3-4 level and moderate spinal stenosis at the C5-6 level.  Degenerative changes at the C1-C2 articulation. Facet joints intact with severe degenerative changes throughout; degenerative changes account for ankylosis of the left C3-4 facet joint.  Coronal reformatted images demonstrate intact craniocervical junction, intact dens, and intact lateral masses. Facet and uncinate hypertrophy account for multilevel foraminal stenoses including severe left and moderate right C2-3, severe bilateral C3-4, severe right and moderate left C4-5, severe bilateral C5-6, severe bilateral C6-7, and mild to moderate right C7-T1.  IMPRESSION:  1.  No cervical spine fractures identified. 2.  Multilevel degenerative disc disease, spondylosis, and facet degenerative changes as detailed above.   Original Report Authenticated By: Hulan Saas, M.D.     Current Medications:  . aspirin EC  81 mg Oral Daily  . atorvastatin  80 mg Oral q1800  . cefTRIAXone (ROCEPHIN)  IV  1 g Intravenous Q24H  . clopidogrel  75 mg Oral Q breakfast  . feeding supplement (ENSURE COMPLETE)  237 mL Oral BID  . metoprolol succinate  12.5 mg Oral q morning - 10a  . pantoprazole  40 mg Oral Daily  . sodium chloride  3 mL Intravenous Q12H      ASSESSMENT AND PLAN:    NSTEMI (non-ST elevated myocardial infarction) Cath  results noted. Severe three-vessel coronary disease including left main disease. Given age coronary artery bypassing graft not a good option. Plan medical therapy. Continue aspirin, Plavix, statin and metoprolol.    Mitral valvular regurgitation The anterior leaf of the mitral valve is thickened with some prolapse. There is a posteriorly directed mitral regurgitation jet. The MR is probably at least moderate. This has been described in the past.    Syncope Telemetry with sinus rhythm/NSVT. Question related to infarct.    UTI (lower urinary tract infection) Change rocephin to ampicillin 250 TID and complete 7 days total antibiotics      Protein-calorie malnutrition, severe    Acute on chronic Normocytic anemia-patient's hemoglobin improved with transfusion. No evidence of bleeding. Will need fu with primary care for anemia following DC.  Plan DC today and fu with Dr Wyline Mood in [place] in 2-4 weeks; fu primary care as well. > 30 min PA and physician time D2  Signed, Olga Millers, MD, [facility] 06/08/2013 7:29 AM  7:29 AM 06/08/2013

## 2013-06-09 LAB — CULTURE, BLOOD (ROUTINE X 2): Culture: NO GROWTH

## 2013-06-12 NOTE — Telephone Encounter (Signed)
.  left message to have patient return my call.  

## 2013-06-13 NOTE — Telephone Encounter (Signed)
.  left message to have patient return my call.  

## 2013-06-15 NOTE — Telephone Encounter (Signed)
Patient contacted regarding discharge from [facility] on 06-04-13.  Patient understands to follow up with provider Dr. Purvis Sheffield   on 06-22-13 at [facility] . Patient understands discharge instructions? YES Patient understands medications and regiment? YES Patient understands to bring all medications to this visit? YES     Pt wanted to clarify if he can start driving yet, advised pt the following notations was located in his discharge summary as follows:  Comments:  No driving until cleared by your heart doctor Pt understood and will not drive until he is evaluated by Dr. Purvis Sheffield

## 2013-06-22 ENCOUNTER — Ambulatory Visit (INDEPENDENT_AMBULATORY_CARE_PROVIDER_SITE_OTHER): Payer: Medicare Other | Admitting: Cardiovascular Disease

## 2013-06-22 ENCOUNTER — Encounter: Payer: Self-pay | Admitting: Cardiovascular Disease

## 2013-06-22 VITALS — BP 120/60 | HR 73 | Ht 72.0 in | Wt 136.0 lb

## 2013-06-22 DIAGNOSIS — I340 Nonrheumatic mitral (valve) insufficiency: Secondary | ICD-10-CM

## 2013-06-22 DIAGNOSIS — I059 Rheumatic mitral valve disease, unspecified: Secondary | ICD-10-CM

## 2013-06-22 DIAGNOSIS — R55 Syncope and collapse: Secondary | ICD-10-CM

## 2013-06-22 DIAGNOSIS — I252 Old myocardial infarction: Secondary | ICD-10-CM

## 2013-06-22 DIAGNOSIS — E785 Hyperlipidemia, unspecified: Secondary | ICD-10-CM

## 2013-06-22 DIAGNOSIS — I739 Peripheral vascular disease, unspecified: Secondary | ICD-10-CM

## 2013-06-22 DIAGNOSIS — I2510 Atherosclerotic heart disease of native coronary artery without angina pectoris: Secondary | ICD-10-CM

## 2013-06-22 NOTE — Patient Instructions (Signed)
Your physician recommends that you schedule a follow-up appointment in: 2 months with Dr.Koneswaran   Your physician recommends that you continue on your current medications as directed. Please refer to the Current Medication list given to you today.      

## 2013-06-22 NOTE — Progress Notes (Signed)
Patient ID: CHET GREENLEY, male   DOB: 24-Dec-1917, 79 y.o.   MRN: [id number]      SUBJECTIVE: Mr. Spalla presents for post-hospitalization f/u for severe multivessel CAD. He had sustained a pre-syncopal episode and was admitted to [facility], where he was subsequently diagnosed with a NSTEMI. He was transferred to [facility] where cardiac catheterization revealed severe LMCA (70-80% distal) and 3-vessel disease, deemed both unsuitable for PCI and given his advanced age, CABG was not an option. Thus, he has been treated medically. He was also transfused for acute on chronic normocytic anemia and also had a UTI (E. Coli). An echo revealed moderate MR with mild anterior leaflet prolapse and new WMA's when compared to a previous echo from 02/2013, with an EF of 50%.  He is here today with his girlfriend, [name], and his daughter who lives in [place].  The patient forgot to pick up his Plavix. He denies chest pain, shortness of breath, fevers, bleeding, leg swelling, palpitations, and diaphoresis. He has been receiving home health care and has had no difficulty ambulating.  He is very interested in driving but has not done so since being discharged.    No Known Allergies  Current Outpatient Prescriptions  Medication Sig Dispense Refill  . atorvastatin (LIPITOR) 40 MG tablet Take 1 tablet (40 mg total) by mouth every evening.  30 tablet  6  . clopidogrel (PLAVIX) 75 MG tablet Take 1 tablet (75 mg total) by mouth daily.  30 tablet  6  . metoprolol succinate (TOPROL-XL) 25 MG 24 hr tablet Take 0.5 tablets (12.5 mg total) by mouth every morning.      . nitroGLYCERIN (NITROSTAT) 0.4 MG SL tablet Place 1 tablet (0.4 mg total) under the tongue every 5 (five) minutes as needed for chest pain (up to 3 doses).  25 tablet  3  . pantoprazole (PROTONIX) 40 MG tablet Take 1 tablet (40 mg total) by mouth daily.  30 tablet  2   No current facility-administered medications for this visit.    Past Medical History  Diagnosis  Date  . CAD (coronary artery disease)     a. NSTEMI 2010 felt due to AF-RVR in setting of multivessel CAD, not surgical candidate. b. NSTEMI trop >20, 05/2013 - 3v CAD incl LM - for med rx.  . MI, old   . Mitral valvular regurgitation     a. Mod by echo 10/20114.  . Atrial fibrillation   . Hypertension   . Syncope     a. 05/2013 admission: felt due to NSTEMI vs orthostasis.  Marland Kitchen NSVT (nonsustained ventricular tachycardia)     a. 05/2013 admission on tele. No sustained arrhythmias.  . Severe protein-calorie malnutrition   . Chronic anemia     a. s/p transfusion 05/2013.  . Orthostatic hypotension     a. During 05/2013 admission (BP 147->84 lying to sitting w/ associated dizziness).  . Paroxysmal a-fib   . COPD (chronic obstructive pulmonary disease)   . Hyperlipidemia   . PAD (peripheral artery disease)     No past surgical history on file.  History   Social History  . Marital Status: Widowed    Spouse Name: N/A    Number of Children: N/A  . Years of Education: N/A   Occupational History  . Not on file.   Social History Main Topics  . Smoking status: Current Some Day Smoker  . Smokeless tobacco: Not on file  . Alcohol Use: Yes  . Drug Use: No  . Sexual  Activity: Not on file   Other Topics Concern  . Not on file   Social History Narrative  . No narrative on file     Filed Vitals:   06/22/13 0928  BP: 120/60  Pulse: 73  Height: 6' (1.829 m)  Weight: 136 lb (61.689 kg)    PHYSICAL EXAM General: NAD, elderly Neck: No JVD, no thyromegaly or thyroid nodule.  Lungs: Clear to auscultation bilaterally with normal respiratory effort. CV: Nondisplaced PMI.  Heart regular S1/S2, +S3 best heard at LLSB, no S4, IV/VI apical holosystolic murmur which radiates to the left posterior lung field.  No peripheral edema.  No carotid bruit.   Abdomen: Soft, nontender, no hepatosplenomegaly, no distention.  Neurologic: Alert and oriented x 3.  Psych: Normal  affect. Extremities: No clubbing or cyanosis.   ECG: reviewed and available in electronic records.      ASSESSMENT AND PLAN: 1. Severe LMCA and 3-vessel CAD with low normal LV systolic function: will continue to manage medically. I encouraged him to start his Plavix ASAP. He is symptomatically stable. His severe disease is both unsuitable for PCI and would be at a very high risk for complications for CABG given his advanced age. 2. Pre-syncope: this may have been due to NSTEMI vs orthostatic hypotension. His BP is normal. I have advised him not to drive and asked his daughter to contact me in a month to update me on his progress. 3. Mitral regurgitation: moderate with mild anterior leaflet prolapse. Will manage medically. No need for diuretics at this time. 4. Hyperlipidemia: currently on Lipitor. 5. Paroxysmal atrial fibrillation: currently in a normal rhythm. 6. PAD: restart Plavix.    Prentice Docker, M.D., F.A.C.C.

## 2013-08-20 ENCOUNTER — Encounter: Payer: Self-pay | Admitting: Cardiovascular Disease

## 2013-08-20 ENCOUNTER — Ambulatory Visit: Payer: Medicare Other | Admitting: Cardiovascular Disease

## 2013-08-20 ENCOUNTER — Ambulatory Visit (INDEPENDENT_AMBULATORY_CARE_PROVIDER_SITE_OTHER): Payer: Medicare Other | Admitting: Cardiovascular Disease

## 2013-08-20 VITALS — BP 147/85 | HR 78 | Ht 71.0 in | Wt 137.0 lb

## 2013-08-20 DIAGNOSIS — E785 Hyperlipidemia, unspecified: Secondary | ICD-10-CM

## 2013-08-20 DIAGNOSIS — I340 Nonrheumatic mitral (valve) insufficiency: Secondary | ICD-10-CM

## 2013-08-20 DIAGNOSIS — I739 Peripheral vascular disease, unspecified: Secondary | ICD-10-CM

## 2013-08-20 DIAGNOSIS — I2510 Atherosclerotic heart disease of native coronary artery without angina pectoris: Secondary | ICD-10-CM

## 2013-08-20 DIAGNOSIS — I252 Old myocardial infarction: Secondary | ICD-10-CM

## 2013-08-20 DIAGNOSIS — I059 Rheumatic mitral valve disease, unspecified: Secondary | ICD-10-CM

## 2013-08-20 DIAGNOSIS — R55 Syncope and collapse: Secondary | ICD-10-CM

## 2013-08-20 MED ORDER — METOPROLOL SUCCINATE ER 25 MG PO TB24: 25.0000 mg | ORAL_TABLET | Freq: Every day | ORAL | Status: DC

## 2013-08-20 NOTE — Patient Instructions (Signed)
Your physician recommends that you schedule a follow-up appointment in: 3 months  Your physician has recommended you make the following change in your medication:   1) TAKE YOUR METOPROLOL XL 12.5MG  (HALF OF THE 25MG  TABLET THAT YOU WERE PRESCRIBED) ONCE DAILY

## 2013-08-20 NOTE — Progress Notes (Signed)
Patient ID: Jeffrey [name], male   DOB: 1917-11-09, 79 y.o.   MRN: [id number]      SUBJECTIVE: Jeffrey [name] presents for f/u of severe multivessel CAD. He had sustained a pre-syncopal episode and was admitted to [facility], where he was subsequently diagnosed with a NSTEMI. He was transferred to [facility] where cardiac catheterization revealed severe LMCA (70-80% distal) and 3-vessel disease, deemed both unsuitable for PCI and given his advanced age, CABG was not an option. Thus, he has been treated medically. He was also transfused for acute on chronic normocytic anemia and also had a UTI (E. Coli). An echo revealed moderate MR with mild anterior leaflet prolapse and new WMA's when compared to a previous echo from 02/2013, with an EF of 50%.   He is here today with his girlfriend, Jeffrey [name]. He also has a daughter who lives in [place].  He denies chest pain, shortness of breath, syncope, and leg swelling. He very seldom has palpitations.  He thinks he may have run out of metoprolol.  He has resumed driving.    No Known Allergies  Current Outpatient Prescriptions  Medication Sig Dispense Refill  . atorvastatin (LIPITOR) 40 MG tablet Take 1 tablet (40 mg total) by mouth every evening.  30 tablet  6  . clopidogrel (PLAVIX) 75 MG tablet Take 1 tablet (75 mg total) by mouth daily.  30 tablet  6  . nitroGLYCERIN (NITROSTAT) 0.4 MG SL tablet Place 1 tablet (0.4 mg total) under the tongue every 5 (five) minutes as needed for chest pain (up to 3 doses).  25 tablet  3  . pantoprazole (PROTONIX) 40 MG tablet Take 1 tablet (40 mg total) by mouth daily.  30 tablet  2  . metoprolol succinate (TOPROL-XL) 25 MG 24 hr tablet Take 0.5 tablets (12.5 mg total) by mouth every morning.       No current facility-administered medications for this visit.    Past Medical History  Diagnosis Date  . CAD (coronary artery disease)     a. NSTEMI 2010 felt due to AF-RVR in setting of multivessel CAD, not surgical candidate. b.  NSTEMI trop >20, 05/2013 - 3v CAD incl LM - for med rx.  . MI, old   . Mitral valvular regurgitation     a. Mod by echo 10/20114.  . Atrial fibrillation   . Hypertension   . Syncope     a. 05/2013 admission: felt due to NSTEMI vs orthostasis.  Marland Kitchen NSVT (nonsustained ventricular tachycardia)     a. 05/2013 admission on tele. No sustained arrhythmias.  . Severe protein-calorie malnutrition   . Chronic anemia     a. s/p transfusion 05/2013.  . Orthostatic hypotension     a. During 05/2013 admission (BP 147->84 lying to sitting w/ associated dizziness).  . Paroxysmal a-fib   . COPD (chronic obstructive pulmonary disease)   . Hyperlipidemia   . PAD (peripheral artery disease)     No past surgical history on file.  History   Social History  . Marital Status: Widowed    Spouse Name: Jeffrey [name]    Number of Children: Jeffrey [name]  . Years of Education: Jeffrey [name]   Occupational History  . Not on file.   Social History Main Topics  . Smoking status: Current Some Day Smoker  . Smokeless tobacco: Not on file  . Alcohol Use: Yes  . Drug Use: No  . Sexual Activity: Not on file   Other Topics Concern  . Not on file  Social History Narrative  . No narrative on file     Filed Vitals:   08/20/13 0930  BP: 147/85  Pulse: 78  Height: 5\' 11"  (1.803 m)  Weight: 137 lb (62.143 kg)    PHYSICAL EXAM General: NAD Neck: No JVD, no thyromegaly or thyroid nodule.  Lungs: Clear to auscultation bilaterally with normal respiratory effort. CV: Nondisplaced PMI.  Mostly regular with brief periods of irregularity, normal rate, normal S1/S2, no S3/S4, I/VI systolic murmur along left sternal border.  No peripheral edema.  No carotid bruit.  Normal pedal pulses.  Abdomen: Soft, nontender, no hepatosplenomegaly, no distention.  Neurologic: Alert and oriented x 3.  Psych: Normal affect. Extremities: No clubbing or cyanosis.   ECG: reviewed and available in electronic records.      ASSESSMENT AND PLAN: 1.  Severe LMCA and 3-vessel CAD with low normal LV systolic function: will continue to manage medically. I will refill his metoprolol. He is symptomatically stable. His severe disease is both unsuitable for PCI and would be at a very high risk for complications for CABG given his advanced age.  2. Pre-syncope: this may have been due to NSTEMI vs orthostatic hypotension. His BP is normal. He has had no further episodes. 3. Mitral regurgitation: moderate with mild anterior leaflet prolapse. Will manage medically. No need for diuretics at this time.  4. Hyperlipidemia: currently on Lipitor.  5. Paroxysmal atrial fibrillation: currently in a normal rhythm.  6. PAD: continue Plavix.  Dispo: f/u 3 months.  Prentice Docker, M.D., F.A.C.C.

## 2013-08-27 ENCOUNTER — Encounter: Payer: Self-pay | Admitting: Physician Assistant

## 2013-08-27 ENCOUNTER — Ambulatory Visit (INDEPENDENT_AMBULATORY_CARE_PROVIDER_SITE_OTHER): Payer: Medicare Other | Admitting: Physician Assistant

## 2013-08-27 VITALS — BP 126/60 | HR 72 | Temp 97.9°F | Resp 18 | Wt 129.0 lb

## 2013-08-27 DIAGNOSIS — D649 Anemia, unspecified: Secondary | ICD-10-CM

## 2013-08-27 DIAGNOSIS — I059 Rheumatic mitral valve disease, unspecified: Secondary | ICD-10-CM

## 2013-08-27 DIAGNOSIS — I340 Nonrheumatic mitral (valve) insufficiency: Secondary | ICD-10-CM

## 2013-08-27 DIAGNOSIS — E43 Unspecified severe protein-calorie malnutrition: Secondary | ICD-10-CM

## 2013-08-27 DIAGNOSIS — E785 Hyperlipidemia, unspecified: Secondary | ICD-10-CM

## 2013-08-27 DIAGNOSIS — I2510 Atherosclerotic heart disease of native coronary artery without angina pectoris: Secondary | ICD-10-CM

## 2013-08-27 DIAGNOSIS — R739 Hyperglycemia, unspecified: Secondary | ICD-10-CM

## 2013-08-27 DIAGNOSIS — R634 Abnormal weight loss: Secondary | ICD-10-CM

## 2013-08-27 DIAGNOSIS — R7309 Other abnormal glucose: Secondary | ICD-10-CM

## 2013-08-27 LAB — HEMOGLOBIN A1C: Mean Plasma Glucose: 137 mg/dL — ABNORMAL HIGH (ref <117)

## 2013-08-27 LAB — CBC WITH DIFFERENTIAL/PLATELET
Basophils Absolute: 0 10*3/uL (ref 0.0–0.1)
Eosinophils Absolute: 0.1 10*3/uL (ref 0.0–0.7)
HCT: 31.8 % — ABNORMAL LOW (ref 39.0–52.0)
Lymphocytes Relative: 23 % (ref 12–46)
Lymphs Abs: 1.4 10*3/uL (ref 0.7–4.0)
MCHC: 33.6 g/dL (ref 30.0–36.0)
Neutro Abs: 3.9 10*3/uL (ref 1.7–7.7)
Neutrophils Relative %: 68 % (ref 43–77)
Platelets: 182 10*3/uL (ref 150–400)
RBC: 3.64 MIL/uL — ABNORMAL LOW (ref 4.22–5.81)
RDW: 13.8 % (ref 11.5–15.5)
WBC: 5.9 10*3/uL (ref 4.0–10.5)

## 2013-08-27 LAB — COMPLETE METABOLIC PANEL WITH GFR
ALT: <8 U/L (ref 0–53)
AST: 14 U/L (ref 0–37)
CO2: 26 mEq/L (ref 19–32)
Calcium: 9.1 mg/dL (ref 8.4–10.5)
Chloride: 102 mEq/L (ref 96–112)
GFR, Est African American: 70 mL/min
Sodium: 139 mEq/L (ref 135–145)
Total Protein: 7.1 g/dL (ref 6.0–8.3)

## 2013-08-27 NOTE — Progress Notes (Signed)
Patient ID: Jeffrey [name] MRN: [id number], DOB: [birth year], 79 y.o. Date of Encounter: @DATE @  Chief Complaint:  Chief Complaint  Patient presents with  . 3 mth check up    HPI: 79 y.o. year old male  presents with his girlfriend for routine followup office visit today.  I see in the computer system that he just recently had a followup office visit with cardiology as well.  He has no specific complaints today. No recent syncope or presyncope. No chest discomfort or increased shortness of breath.  Girlfriend states that she is present for all of his meals. Says that sometimes he doesn't have much of an appetite but in general he is eating pretty good. I asked if he was fasting currently he says no that he had coffee and an egg sand which this morning. I asked About Ensure. He says, "Oh yeah, I like those. I drink them sometimes. "     Past Medical History  Diagnosis Date  . CAD (coronary artery disease)     a. NSTEMI 2010 felt due to AF-RVR in setting of multivessel CAD, not surgical candidate. b. NSTEMI trop >20, 05/2013 - 3v CAD incl LM - for med rx.  . MI, old   . Mitral valvular regurgitation     a. Mod by echo 10/20114.  . Atrial fibrillation   . Hypertension   . Syncope     a. 05/2013 admission: felt due to NSTEMI vs orthostasis.  Marland Kitchen NSVT (nonsustained ventricular tachycardia)     a. 05/2013 admission on tele. No sustained arrhythmias.  . Severe protein-calorie malnutrition   . Chronic anemia     a. s/p transfusion 05/2013.  . Orthostatic hypotension     a. During 05/2013 admission (BP 147->84 lying to sitting w/ associated dizziness).  . Paroxysmal a-fib   . COPD (chronic obstructive pulmonary disease)   . Hyperlipidemia   . PAD (peripheral artery disease)      Home Meds: See attached medication section for current medication list. Any medications entered into computer today will not appear on this note's list. The medications listed below were entered  prior to today. Current Outpatient Prescriptions on File Prior to Visit  Medication Sig Dispense Refill  . atorvastatin (LIPITOR) 40 MG tablet Take 1 tablet (40 mg total) by mouth every evening.  30 tablet  6  . clopidogrel (PLAVIX) 75 MG tablet Take 1 tablet (75 mg total) by mouth daily.  30 tablet  6  . metoprolol succinate (TOPROL-XL) 25 MG 24 hr tablet Take 1 tablet (25 mg total) by mouth daily.  30 tablet  6  . nitroGLYCERIN (NITROSTAT) 0.4 MG SL tablet Place 1 tablet (0.4 mg total) under the tongue every 5 (five) minutes as needed for chest pain (up to 3 doses).  25 tablet  3  . pantoprazole (PROTONIX) 40 MG tablet Take 1 tablet (40 mg total) by mouth daily.  30 tablet  2   No current facility-administered medications on file prior to visit.    Allergies: No Known Allergies  History   Social History  . Marital Status: Widowed    Spouse Name: N/A    Number of Children: N/A  . Years of Education: N/A   Occupational History  . Not on file.   Social History Main Topics  . Smoking status: Current Some Day Smoker  . Smokeless tobacco: Not on file  . Alcohol Use: Yes  . Drug Use: No  . Sexual Activity: Not on  file   Other Topics Concern  . Not on file   Social History Narrative  . No narrative on file    No family history on file.   Review of Systems:  See HPI for pertinent ROS. All other ROS negative.    Physical Exam: Blood pressure 126/60, pulse 72, temperature 97.9 F (36.6 C), temperature source Oral, resp. rate 18, weight 129 lb (58.514 kg)., Body mass index is 18 kg/(m^2). General: Extremely thin African American male .Appears in no acute distress. Neck: Supple. No thyromegaly. No lymphadenopathy. Lungs: Clear bilaterally to auscultation without wheezes, rales, or rhonchi. Breathing is unlabored. Heart: RRR with S1 S2. IV/VI murmur loudest at the apex. Abdomen: Soft, non-tender, non-distended with normoactive bowel sounds. No hepatomegaly. No rebound/guarding.  No obvious abdominal masses. Musculoskeletal:  Strength and tone normal for age. Extremities/Skin: Warm and dry. No clubbing or cyanosis. No edema. No rashes or suspicious lesions. Neuro: Alert and oriented X 3. Moves all extremities spontaneously. Gait is normal. CNII-XII grossly in tact. Psych:  Responds to questions appropriately with a normal affect.     ASSESSMENT AND PLAN:  79 y.o. year old male with  1. CAD Is managed by cardiology. He has no severe LMCA and 3 vessel CAD with low-normal LV systolic function. Will continue to manage medically. This is unsuitable for PCI and he would be very high risk of complications with CABG given his advanced age.  2. Mitral valvular regurgitation This is being managed by cardiology. Medical management.  3. HYPERLIPIDEMIA He has had no lipid panel in > one year. However he is again not fasting today. Therefore we'll just check LFTs to monitor for any adverse effects while on statin therapy. - COMPLETE METABOLIC PANEL WITH GFR  4. ANEMIA, NORMOCYTIC He was given blood transfusion on 06/09/13. He has had no CBC since. Will recheck now to monitor. In the past his anemia was normocytic and felt to be secondary to chronic disease. Hemoccult by me in the past was negative. - CBC with Differential  5. WEIGHT LOSS Albumin was very low on lab 06/05/13. I told him and a girlfriend for him to start having an Ensure at least one per day every day.  6. Protein-calorie malnutrition, severe See #5 above  7. Hyperglycemia I reviewed labs. On 06/05/13 A1c was 6.3. Repeat now. - Hemoglobin A1c  TSH was also done 06/05/13. TSH was normal.  We'll just check a CBC, CMET, A1cNow.  As long as he is stable he can wait 6 months to come back here for followup office visit. Followup with me sooner if needed.  Signed, [address] [place], [place], [facility] 08/27/2013 11:26 AM

## 2013-08-29 ENCOUNTER — Encounter: Payer: Self-pay | Admitting: Family Medicine

## 2013-10-21 ENCOUNTER — Encounter (HOSPITAL_COMMUNITY): Payer: Self-pay | Admitting: Emergency Medicine

## 2013-10-21 ENCOUNTER — Other Ambulatory Visit: Payer: Self-pay

## 2013-10-21 ENCOUNTER — Emergency Department (HOSPITAL_COMMUNITY): Payer: Medicare Other

## 2013-10-21 ENCOUNTER — Inpatient Hospital Stay (HOSPITAL_COMMUNITY)
Admission: EM | Admit: 2013-10-21 | Discharge: 2013-10-23 | DRG: 292 | Disposition: A | Discharge status: Home / Self Care | Payer: Medicare Other | Attending: Internal Medicine | Admitting: Internal Medicine

## 2013-10-21 DIAGNOSIS — N182 Chronic kidney disease, stage 2 (mild): Secondary | ICD-10-CM | POA: Diagnosis present

## 2013-10-21 DIAGNOSIS — I739 Peripheral vascular disease, unspecified: Secondary | ICD-10-CM | POA: Diagnosis present

## 2013-10-21 DIAGNOSIS — D649 Anemia, unspecified: Secondary | ICD-10-CM

## 2013-10-21 DIAGNOSIS — I340 Nonrheumatic mitral (valve) insufficiency: Secondary | ICD-10-CM | POA: Diagnosis present

## 2013-10-21 DIAGNOSIS — J441 Chronic obstructive pulmonary disease with (acute) exacerbation: Secondary | ICD-10-CM

## 2013-10-21 DIAGNOSIS — E785 Hyperlipidemia, unspecified: Secondary | ICD-10-CM

## 2013-10-21 DIAGNOSIS — F17200 Nicotine dependence, unspecified, uncomplicated: Secondary | ICD-10-CM

## 2013-10-21 DIAGNOSIS — I2510 Atherosclerotic heart disease of native coronary artery without angina pectoris: Secondary | ICD-10-CM

## 2013-10-21 DIAGNOSIS — Z7902 Long term (current) use of antithrombotics/antiplatelets: Secondary | ICD-10-CM

## 2013-10-21 DIAGNOSIS — J449 Chronic obstructive pulmonary disease, unspecified: Secondary | ICD-10-CM

## 2013-10-21 DIAGNOSIS — I509 Heart failure, unspecified: Secondary | ICD-10-CM

## 2013-10-21 DIAGNOSIS — I129 Hypertensive chronic kidney disease with stage 1 through stage 4 chronic kidney disease, or unspecified chronic kidney disease: Secondary | ICD-10-CM | POA: Diagnosis present

## 2013-10-21 DIAGNOSIS — I5031 Acute diastolic (congestive) heart failure: Secondary | ICD-10-CM

## 2013-10-21 DIAGNOSIS — I10 Essential (primary) hypertension: Secondary | ICD-10-CM | POA: Diagnosis present

## 2013-10-21 DIAGNOSIS — I4891 Unspecified atrial fibrillation: Secondary | ICD-10-CM | POA: Diagnosis present

## 2013-10-21 DIAGNOSIS — I059 Rheumatic mitral valve disease, unspecified: Secondary | ICD-10-CM | POA: Diagnosis present

## 2013-10-21 DIAGNOSIS — I480 Paroxysmal atrial fibrillation: Secondary | ICD-10-CM

## 2013-10-21 DIAGNOSIS — D72819 Decreased white blood cell count, unspecified: Secondary | ICD-10-CM | POA: Diagnosis present

## 2013-10-21 DIAGNOSIS — I5033 Acute on chronic diastolic (congestive) heart failure: Principal | ICD-10-CM | POA: Diagnosis present

## 2013-10-21 DIAGNOSIS — Z9861 Coronary angioplasty status: Secondary | ICD-10-CM

## 2013-10-21 DIAGNOSIS — I252 Old myocardial infarction: Secondary | ICD-10-CM

## 2013-10-21 LAB — CBC WITH DIFFERENTIAL/PLATELET
BASOS PCT: 1 % (ref 0–1)
Basophils Absolute: 0 10*3/uL (ref 0.0–0.1)
EOS ABS: 0.1 10*3/uL (ref 0.0–0.7)
Eosinophils Relative: 3 % (ref 0–5)
HCT: 23.9 % — ABNORMAL LOW (ref 39.0–52.0)
Hemoglobin: 7.7 g/dL — ABNORMAL LOW (ref 13.0–17.0)
LYMPHS ABS: 1.3 10*3/uL (ref 0.7–4.0)
Lymphocytes Relative: 31 % (ref 12–46)
MCH: 29.1 pg (ref 26.0–34.0)
MCHC: 32.2 g/dL (ref 30.0–36.0)
MCV: 90.2 fL (ref 78.0–100.0)
Monocytes Absolute: 0.2 10*3/uL (ref 0.1–1.0)
Monocytes Relative: 6 % (ref 3–12)
NEUTROS PCT: 60 % (ref 43–77)
Neutro Abs: 2.4 10*3/uL (ref 1.7–7.7)
Platelets: 189 10*3/uL (ref 150–400)
RBC: 2.65 MIL/uL — ABNORMAL LOW (ref 4.22–5.81)
RDW: 14 % (ref 11.5–15.5)
WBC: 4.1 10*3/uL (ref 4.0–10.5)

## 2013-10-21 LAB — TROPONIN I
Troponin I: <0.3 ng/mL (ref <0.30)
Troponin I: <0.3 ng/mL (ref <0.30)

## 2013-10-21 LAB — BASIC METABOLIC PANEL
BUN: 27 mg/dL — ABNORMAL HIGH (ref 6–23)
CO2: 25 mEq/L (ref 19–32)
Calcium: 8.7 mg/dL (ref 8.4–10.5)
Chloride: 104 mEq/L (ref 96–112)
Creatinine, Ser: 1.18 mg/dL (ref 0.50–1.35)
GFR, EST AFRICAN AMERICAN: 59 mL/min — AB (ref >90)
GFR, EST NON AFRICAN AMERICAN: 51 mL/min — AB (ref >90)
Glucose, Bld: 103 mg/dL — ABNORMAL HIGH (ref 70–99)
POTASSIUM: 3.8 meq/L (ref 3.7–5.3)
Sodium: 141 mEq/L (ref 137–147)

## 2013-10-21 LAB — PRO B NATRIURETIC PEPTIDE: PRO B NATRI PEPTIDE: 4163 pg/mL — AB (ref 0–450)

## 2013-10-21 MED ORDER — ATORVASTATIN CALCIUM 40 MG PO TABS
40.0000 mg | ORAL_TABLET | Freq: Every evening | ORAL | Status: DC
  Administered 2013-10-21 – 2013-10-22 (×2): 40 mg via ORAL
  Filled 2013-10-21 (×2): qty 1

## 2013-10-21 MED ORDER — CLOPIDOGREL BISULFATE 75 MG PO TABS
75.0000 mg | ORAL_TABLET | Freq: Every day | ORAL | Status: DC
  Administered 2013-10-21 – 2013-10-23 (×3): 75 mg via ORAL
  Filled 2013-10-21 (×3): qty 1

## 2013-10-21 MED ORDER — ALBUTEROL SULFATE (2.5 MG/3ML) 0.083% IN NEBU: 2.5000 mg | INHALATION_SOLUTION | Freq: Four times a day (QID) | RESPIRATORY_TRACT | Status: DC

## 2013-10-21 MED ORDER — INFLUENZA VAC SPLIT QUAD 0.5 ML IM SUSP
0.5000 mL | INTRAMUSCULAR | Status: AC
  Administered 2013-10-23: 0.5 mL via INTRAMUSCULAR
  Filled 2013-10-21: qty 0.5

## 2013-10-21 MED ORDER — GUAIFENESIN-DM 100-10 MG/5ML PO SYRP: 5.0000 mL | ORAL_SOLUTION | ORAL | Status: DC | PRN

## 2013-10-21 MED ORDER — ENOXAPARIN SODIUM 40 MG/0.4ML ~~LOC~~ SOLN
40.0000 mg | SUBCUTANEOUS | Status: DC
Start: 2013-10-21 — End: 2013-10-23
  Administered 2013-10-21 – 2013-10-22 (×2): 40 mg via SUBCUTANEOUS
  Filled 2013-10-21 (×2): qty 0.4

## 2013-10-21 MED ORDER — ALBUTEROL SULFATE (2.5 MG/3ML) 0.083% IN NEBU: 2.5000 mg | INHALATION_SOLUTION | RESPIRATORY_TRACT | Status: DC | PRN

## 2013-10-21 MED ORDER — ACETAMINOPHEN 325 MG PO TABS: 650.0000 mg | ORAL_TABLET | Freq: Four times a day (QID) | ORAL | Status: DC | PRN

## 2013-10-21 MED ORDER — PANTOPRAZOLE SODIUM 40 MG PO TBEC
40.0000 mg | DELAYED_RELEASE_TABLET | Freq: Every day | ORAL | Status: DC
  Administered 2013-10-21 – 2013-10-23 (×3): 40 mg via ORAL
  Filled 2013-10-21 (×3): qty 1

## 2013-10-21 MED ORDER — NITROGLYCERIN 0.4 MG SL SUBL: 0.4000 mg | SUBLINGUAL_TABLET | SUBLINGUAL | Status: DC | PRN

## 2013-10-21 MED ORDER — BIOTENE DRY MOUTH MT LIQD
15.0000 mL | Freq: Two times a day (BID) | OROMUCOSAL | Status: DC
  Administered 2013-10-21 – 2013-10-23 (×4): 15 mL via OROMUCOSAL

## 2013-10-21 MED ORDER — IPRATROPIUM-ALBUTEROL 0.5-2.5 (3) MG/3ML IN SOLN
3.0000 mL | Freq: Four times a day (QID) | RESPIRATORY_TRACT | Status: DC
  Administered 2013-10-21 – 2013-10-22 (×4): 3 mL via RESPIRATORY_TRACT
  Filled 2013-10-21 (×4): qty 3

## 2013-10-21 MED ORDER — FUROSEMIDE 10 MG/ML IJ SOLN
40.0000 mg | Freq: Once | INTRAMUSCULAR | Status: AC
  Administered 2013-10-21: 40 mg via INTRAVENOUS
  Filled 2013-10-21: qty 4

## 2013-10-21 MED ORDER — ONDANSETRON HCL 4 MG/2ML IJ SOLN: 4.0000 mg | Freq: Four times a day (QID) | INTRAMUSCULAR | Status: DC | PRN

## 2013-10-21 MED ORDER — IPRATROPIUM BROMIDE 0.02 % IN SOLN: 0.5000 mg | Freq: Four times a day (QID) | RESPIRATORY_TRACT | Status: DC

## 2013-10-21 MED ORDER — METHYLPREDNISOLONE SODIUM SUCC 125 MG IJ SOLR
60.0000 mg | Freq: Three times a day (TID) | INTRAMUSCULAR | Status: DC
  Administered 2013-10-21 – 2013-10-22 (×3): 60 mg via INTRAVENOUS
  Filled 2013-10-21 (×3): qty 2

## 2013-10-21 MED ORDER — METOPROLOL SUCCINATE ER 25 MG PO TB24
25.0000 mg | ORAL_TABLET | Freq: Every day | ORAL | Status: DC
  Administered 2013-10-21 – 2013-10-23 (×3): 25 mg via ORAL
  Filled 2013-10-21 (×3): qty 1

## 2013-10-21 MED ORDER — SODIUM CHLORIDE 0.9 % IJ SOLN
3.0000 mL | Freq: Two times a day (BID) | INTRAMUSCULAR | Status: DC
  Administered 2013-10-22 – 2013-10-23 (×2): 3 mL via INTRAVENOUS

## 2013-10-21 MED ORDER — ONDANSETRON HCL 4 MG PO TABS: 4.0000 mg | ORAL_TABLET | Freq: Four times a day (QID) | ORAL | Status: DC | PRN

## 2013-10-21 MED ORDER — FUROSEMIDE 10 MG/ML IJ SOLN
40.0000 mg | Freq: Two times a day (BID) | INTRAMUSCULAR | Status: DC
  Administered 2013-10-21 – 2013-10-22 (×2): 40 mg via INTRAVENOUS
  Filled 2013-10-21 (×2): qty 4

## 2013-10-21 MED ORDER — GUAIFENESIN ER 600 MG PO TB12
600.0000 mg | ORAL_TABLET | Freq: Two times a day (BID) | ORAL | Status: DC
  Administered 2013-10-21 – 2013-10-23 (×4): 600 mg via ORAL
  Filled 2013-10-21 (×4): qty 1

## 2013-10-21 MED ORDER — ACETAMINOPHEN 650 MG RE SUPP: 650.0000 mg | Freq: Four times a day (QID) | RECTAL | Status: DC | PRN

## 2013-10-21 NOTE — ED Notes (Signed)
Pt has voided two time, one with Bowel movement

## 2013-10-21 NOTE — ED Notes (Signed)
Hemoccult neg

## 2013-10-21 NOTE — ED Notes (Signed)
Pt assisted to bedside commode, family waiting outside door

## 2013-10-21 NOTE — ED Provider Notes (Signed)
CSN: [id number]     Arrival date & time 10/21/13  1028 History  This chart was scribed for Audree Camel, MD by Quintella Reichert, ED scribe.  This patient was seen in room APA01/APA01 and the patient's care was started at 11:29 AM.    Chief Complaint  Patient presents with  . Shortness of Breath      The history is provided by the patient. No language interpreter was used.   HPI Comments: Jeffrey [name] is a 80 y.o. male with a h/o of COPD, CAD, and HTN who presents to the Emergency Department complaining of shortness of breath for the past week with associated rhinorrhea and a productive cough. Pt reports his symptoms unchanged since onset, but he came into the ED at the insistence of his family because of his wheezing. He's been taking over the counter cold medications without any relief. Pt denies fever, pain to any area, vomiting. Pt has a h/o of lower extremity edema. He lives at home.   Past Medical History  Diagnosis Date  . CAD (coronary artery disease)     a. NSTEMI 2010 felt due to AF-RVR in setting of multivessel CAD, not surgical candidate. b. NSTEMI trop >20, 05/2013 - 3v CAD incl LM - for med rx.  . MI, old   . Mitral valvular regurgitation     a. Mod by echo 10/20114.  . Atrial fibrillation   . Hypertension   . Syncope     a. 05/2013 admission: felt due to NSTEMI vs orthostasis.  Marland Kitchen NSVT (nonsustained ventricular tachycardia)     a. 05/2013 admission on tele. No sustained arrhythmias.  . Severe protein-calorie malnutrition   . Chronic anemia     a. s/p transfusion 05/2013.  . Orthostatic hypotension     a. During 05/2013 admission (BP 147->84 lying to sitting w/ associated dizziness).  . Paroxysmal a-fib   . COPD (chronic obstructive pulmonary disease)   . Hyperlipidemia   . PAD (peripheral artery disease)     Past Surgical History  Procedure Laterality Date  . Coronary angioplasty with stent placement      No family history on file. History   Substance Use Topics  . Smoking status: Current Some Day Smoker  . Smokeless tobacco: Not on file  . Alcohol Use: Yes     Comment: occ    Review of Systems  Constitutional: Negative for fever.  HENT: Positive for rhinorrhea.   Respiratory: Positive for cough (productive of sputum), shortness of breath and wheezing.   Gastrointestinal: Negative for vomiting.  Neurological: Negative for headaches.  All other systems reviewed and are negative.      Allergies  Review of patient's allergies indicates no known allergies.  Home Medications   Current Outpatient Rx  Name  Route  Sig  Dispense  Refill  . atorvastatin (LIPITOR) 40 MG tablet   Oral   Take 1 tablet (40 mg total) by mouth every evening.   30 tablet   6   . clopidogrel (PLAVIX) 75 MG tablet   Oral   Take 1 tablet (75 mg total) by mouth daily.   30 tablet   6   . metoprolol succinate (TOPROL-XL) 25 MG 24 hr tablet   Oral   Take 1 tablet (25 mg total) by mouth daily.   30 tablet   6   . nitroGLYCERIN (NITROSTAT) 0.4 MG SL tablet   Sublingual   Place 1 tablet (0.4 mg total) under the tongue every 5 (  five) minutes as needed for chest pain (up to 3 doses).   25 tablet   3   . pantoprazole (PROTONIX) 40 MG tablet   Oral   Take 1 tablet (40 mg total) by mouth daily.   30 tablet   2    Triage Vitals: BP 124/67  Pulse 83  Temp(Src) 97.7 F (36.5 C) (Oral)  Resp 18  Ht 5\' 11"  (1.803 m)  Wt 129 lb (58.514 kg)  BMI 18.00 kg/m2  SpO2 95%  Physical Exam  Nursing note and vitals reviewed. Constitutional: He is oriented to person, place, and time. He appears well-developed and well-nourished. No distress.  HENT:  Head: Normocephalic and atraumatic.  Eyes: EOM are normal.  Neck: Neck supple. No tracheal deviation present.  Cardiovascular: Normal rate.   Murmur heard. Pulmonary/Chest: Effort normal. No respiratory distress. He has rales (fine rales in bilateral bases).  Musculoskeletal: Normal range of  motion. He exhibits edema.  Trace pitting edema in bilateral lower extremities   Neurological: He is alert and oriented to person, place, and time.  Skin: Skin is warm and dry.  Psychiatric: He has a normal mood and affect. His behavior is normal.    ED Course  Procedures (including critical care time)  DIAGNOSTIC STUDIES: Oxygen Saturation is 95% on [facility], adequate by my interpretation.    COORDINATION OF CARE:  11:35 AM-Discussed treatment plan which includes CXR, Oxygen therapy, and labs with pt at bedside and pt agreed to plan.    Labs Review Labs Reviewed  CBC WITH DIFFERENTIAL - Abnormal; Notable for the following:    RBC 2.65 (*)    Hemoglobin 7.7 (*)    HCT 23.9 (*)    All other components within normal limits  BASIC METABOLIC PANEL - Abnormal; Notable for the following:    Glucose, Bld 103 (*)    BUN 27 (*)    GFR calc non Af Amer 51 (*)    GFR calc Af Amer 59 (*)    All other components within normal limits  PRO B NATRIURETIC PEPTIDE - Abnormal; Notable for the following:    Pro B Natriuretic peptide (BNP) 4163.0 (*)    All other components within normal limits  TROPONIN I  POC OCCULT BLOOD, ED   Imaging Review Dg Chest Portable 1 View  10/21/2013   CLINICAL DATA:  Shortness of breath.  Cough.  Chest congestion.  EXAM: PORTABLE CHEST - 1 VIEW  COMPARISON:  06/04/2013  FINDINGS: Cardiomegaly again demonstrated. Diffuse bilateral interstitial infiltrates are seen, consistent diffuse pulmonary edema. Probable small bilateral pleural effusions and bibasilar atelectasis also noted.  IMPRESSION: Diffuse pulmonary edema with probable small pleural effusions and bibasilar atelectasis.   Electronically Signed   By: Myles RosenthalJohn  Stahl M.D.   On: 10/21/2013 11:40    EKG Interpretation   None      Muse interface not working   Date: 10/21/2013  Rate: 80  Rhythm: normal sinus rhythm  QRS Axis: normal  Intervals: QT prolonged  ST/T Wave abnormalities: nonspecific T wave  changes  Conduction Disutrbances:right bundle branch block  Narrative Interpretation: ST-T segments improved from most recent EKG  Old EKG Reviewed: changes noted   MDM   Final diagnoses:  CHF exacerbation  Anemia    Patient has not CP. No fevers or elevated WBC. Sx initially concerning for infectious source, but with bilateral pulmonary edema and elevated BNP this is more likely heart related. Unk why he has hemoglobin drop. No active bleeding, stool hemoccult  negative. D/w cards, Dr. Mayford Knife, who recommends lasix and admission to hospitalist with cards consult in AM.  I personally performed the services described in this documentation, which was scribed in my presence. The recorded information has been reviewed and is accurate.    Audree Camel, MD 10/21/13 1455

## 2013-10-21 NOTE — ED Notes (Signed)
Pt c/o SOB and runny nose since last week.  Denies any cough or fever.

## 2013-10-21 NOTE — ED Notes (Signed)
Patient placed on continuous cardiac monitoring, continuous pulse 0x monitoring 

## 2013-10-21 NOTE — ED Notes (Signed)
Lab at the bedside 

## 2013-10-21 NOTE — H&P (Signed)
History and Physical  Jeffrey [name] WNU:[id number] DOB: 09/26/1917 DOA: 10/21/2013  Referring physician: EDP PCP: [name], PA-C  Outpatient Specialists:  1. Cardiology: Dr. Prentice Docker  Chief Complaint: Worsening dyspnea  HPI: Jeffrey [name] is a 80 y.o. male with history of COPD not on home oxygen, ongoing tobacco abuse, CAD, NSTEMI 2010 felt due to A. fib with RVR in the setting of multivessel CAD, not surgical candidate, moderate MR, PAF, HTN, NSVT, chronic anemia, HLD and PAD presented to the ED on 10/21/13 with complaints of worsening dyspnea. He gives a one-week history of progressively worsening dyspnea & PND but denies of orthopnea, chest pain or worsening leg edema. He has been having cough productive of white sputum without fever or chills. Wheezing +. He claims compliance with his medications and salt restricted diet. He is not on diuretics at home. He continues to smoke 4-5 cigarettes per day. He denies nausea, vomiting, diarrhea, hematemesis, melena or bleeding elsewhere. In the ED, hemoglobin 7.7 down from 10.7 on 08/27/13, proBNP 4163, troponin x1 negative, chest x-ray suggestive of diffuse pulmonary edema with probable small pleural effusion seen by basilar atelectasis. Hospitalist admission requested.   Review of Systems: All systems reviewed and apart from history of presenting illness, are negative.  Past Medical History  Diagnosis Date  . CAD (coronary artery disease)     a. NSTEMI 2010 felt due to AF-RVR in setting of multivessel CAD, not surgical candidate. b. NSTEMI trop >20, 05/2013 - 3v CAD incl LM - for med rx.  . MI, old   . Mitral valvular regurgitation     a. Mod by echo 10/20114.  . Atrial fibrillation   . Hypertension   . Syncope     a. 05/2013 admission: felt due to NSTEMI vs orthostasis.  Marland Kitchen NSVT (nonsustained ventricular tachycardia)     a. 05/2013 admission on tele. No sustained arrhythmias.  . Severe protein-calorie malnutrition     . Chronic anemia     a. s/p transfusion 05/2013.  . Orthostatic hypotension     a. During 05/2013 admission (BP 147->84 lying to sitting w/ associated dizziness).  . Paroxysmal a-fib   . COPD (chronic obstructive pulmonary disease)   . Hyperlipidemia   . PAD (peripheral artery disease)    Past Surgical History  Procedure Laterality Date  . Coronary angioplasty with stent placement     Social History:  reports that he has been smoking.  He does not have any smokeless tobacco history on file. He reports that he drinks alcohol. He reports that he does not use illicit drugs. patient lives with his girlfriend and is independent of activities of daily living. At times he uses a cane to ambulate when he is outside his house.   No Known Allergies  No family history on file. negative family history.  Prior to Admission medications   Medication Sig Start Date End Date Taking? Authorizing Provider  atorvastatin (LIPITOR) 40 MG tablet Take 1 tablet (40 mg total) by mouth every evening. 06/08/13  Yes Dayna N Dunn, PA-C  clopidogrel (PLAVIX) 75 MG tablet Take 1 tablet (75 mg total) by mouth daily. 06/08/13  Yes Dayna N Dunn, PA-C  metoprolol succinate (TOPROL-XL) 25 MG 24 hr tablet Take 1 tablet (25 mg total) by mouth daily. 08/20/13  Yes Laqueta Linden, MD  nitroGLYCERIN (NITROSTAT) 0.4 MG SL tablet Place 1 tablet (0.4 mg total) under the tongue every 5 (five) minutes as needed for chest pain (up to  3 doses). 06/08/13  Yes Dayna N Dunn, PA-C  pantoprazole (PROTONIX) 40 MG tablet Take 1 tablet (40 mg total) by mouth daily. 06/08/13  Yes Laurann Montana, PA-C   Physical Exam: Filed Vitals:   10/21/13 1157 10/21/13 1200 10/21/13 1300 10/21/13 1400  BP: 128/64 113/61 136/85 143/80  Pulse: 76   74  Temp: 97.9 F (36.6 C)     TempSrc: Rectal     Resp: 18 15 14 20   Height:      Weight:      SpO2: 99%   97%     General exam: Moderately built and nourished  elderly male patient,  sitting  propped up on the gurney in mild respiratory distress .  Head, eyes and ENT: Nontraumatic and normocephalic. Pupils equally reacting to light and accommodation. Oral mucosa moist.  Neck: Supple. No JVD, carotid bruit or thyromegaly.  Lymphatics: No lymphadenopathy.  Respiratory system:  reduced breath sounds bilaterally with scattered bilateral coarse rhonchi and a few basal crackles left greater than sign right. Mild increased work of breathing. Able to speak comfortably in small sentences .  Cardiovascular system: S1 and S2 heard, RRR.  JVD + +. Trace bilateral ankle edema. 3/6 pansystolic murmur best heard at the apex and radiates to left back . No gallops heard.   Gastrointestinal system: Abdomen is nondistended, soft and nontender. Normal bowel sounds heard. No organomegaly or masses appreciated.  Central nervous system: Alert and oriented. No focal neurological deficits.  Extremities: Symmetric 5 x 5 power. Peripheral pulses symmetrically felt.   Skin: No rashes or acute findings.  Musculoskeletal system: Negative exam.  Psychiatry: Pleasant and cooperative.   Labs on Admission:  Basic Metabolic Panel:  Recent Labs Lab 10/21/13 1108  NA 141  K 3.8  CL 104  CO2 25  GLUCOSE 103*  BUN 27*  CREATININE 1.18  CALCIUM 8.7   Liver Function Tests: No results found for this basename: AST, ALT, ALKPHOS, BILITOT, PROT, ALBUMIN,  in the last 168 hours No results found for this basename: LIPASE, AMYLASE,  in the last 168 hours No results found for this basename: AMMONIA,  in the last 168 hours CBC:  Recent Labs Lab 10/21/13 1108  WBC 4.1  NEUTROABS 2.4  HGB 7.7*  HCT 23.9*  MCV 90.2  PLT 189   Cardiac Enzymes:  Recent Labs Lab 10/21/13 1108  TROPONINI <0.30    BNP (last 3 results)  Recent Labs  10/21/13 1144  PROBNP 4163.0*   CBG: No results found for this basename: GLUCAP,  in the last 168 hours  Radiological Exams on Admission: Dg Chest Portable 1  View  10/21/2013   CLINICAL DATA:  Shortness of breath.  Cough.  Chest congestion.  EXAM: PORTABLE CHEST - 1 VIEW  COMPARISON:  06/04/2013  FINDINGS: Cardiomegaly again demonstrated. Diffuse bilateral interstitial infiltrates are seen, consistent diffuse pulmonary edema. Probable small bilateral pleural effusions and bibasilar atelectasis also noted.  IMPRESSION: Diffuse pulmonary edema with probable small pleural effusions and bibasilar atelectasis.   Electronically Signed   By: Myles Rosenthal M.D.   On: 10/21/2013 11:40    EKG: Independently reviewed.  sinus rhythm, frequent PVCs, normal axis, LAE, RBBB (not new) in no acute changes.  Assessment/Plan Principal Problem:   Diastolic CHF, acute Active Problems:   HYPERLIPIDEMIA   ANEMIA, NORMOCYTIC   SMOKER   CAD   PAROXYSMAL ATRIAL FIBRILLATION   PVD   Mitral valvular regurgitation   Hypertension   COPD exacerbation  1. Acute diastolic CHF: Unclear precipitating factor-? Worsening anemia/COPD exacerbation. Admit to telemetry. Diurese with IV Lasix 40 mg every 12 hours. Recent echo 06/06/13 showed LVEF 50% and diastolic dysfunction. Cycle cardiac enzymes. Monitor closely. 2. COPD exacerbation: His worsening dyspnea is probably a combination of acute diastolic CHF and COPD exacerbation. Treat with oxygen, bronchodilator nebulizations and IV Solu-Medrol. In the absence of fever, leukocytosis a productive cough, will hold off on antibiotics. 3. Anemia, acute on chronic: No overt bleeding. FOBT by EDP negative. Follow CBC in a.m. and transfuse if hemoglobin less than 7 g per DL. 4. Tobacco abuse: Cessation counseled. 5. CAD-multivessel disease/PAF/moderate MR/h/o NSTEMI: Not a surgical candidate. Currently in sinus rhythm. Continue beta blockers, Plavix and cycle troponin. EDP discussed patient's care with cardiologist on call. 6. Hypertension: Controlled     Code Status:  Full  Family Communication:  discussed with daughter at bedside.    Disposition Plan:  home in medically stable   Time spent:  60 minutes  Elizar Alpern, MD, FACP, FHM. [facility] Pager [phone]  If 7PM-7AM, please contact night-coverage [email or web address] Password [email or web address] 10/21/2013, 3:51 PM

## 2013-10-22 ENCOUNTER — Inpatient Hospital Stay (HOSPITAL_COMMUNITY): Payer: Medicare Other

## 2013-10-22 LAB — BASIC METABOLIC PANEL
BUN: 27 mg/dL — ABNORMAL HIGH (ref 6–23)
CALCIUM: 8.6 mg/dL (ref 8.4–10.5)
CO2: 29 mEq/L (ref 19–32)
CREATININE: 1.24 mg/dL (ref 0.50–1.35)
Chloride: 104 mEq/L (ref 96–112)
GFR calc non Af Amer: 48 mL/min — ABNORMAL LOW (ref >90)
GFR, EST AFRICAN AMERICAN: 56 mL/min — AB (ref >90)
Glucose, Bld: 161 mg/dL — ABNORMAL HIGH (ref 70–99)
Potassium: 4.3 mEq/L (ref 3.7–5.3)
Sodium: 142 mEq/L (ref 137–147)

## 2013-10-22 LAB — OCCULT BLOOD, POC DEVICE: Fecal Occult Bld: NEGATIVE

## 2013-10-22 LAB — CBC
HCT: 23.3 % — ABNORMAL LOW (ref 39.0–52.0)
Hemoglobin: 7.7 g/dL — ABNORMAL LOW (ref 13.0–17.0)
MCH: 29.2 pg (ref 26.0–34.0)
MCHC: 33 g/dL (ref 30.0–36.0)
MCV: 88.3 fL (ref 78.0–100.0)
Platelets: 186 10*3/uL (ref 150–400)
RBC: 2.64 MIL/uL — ABNORMAL LOW (ref 4.22–5.81)
RDW: 13.9 % (ref 11.5–15.5)
WBC: 2.3 10*3/uL — ABNORMAL LOW (ref 4.0–10.5)

## 2013-10-22 LAB — TROPONIN I: Troponin I: <0.3 ng/mL (ref <0.30)

## 2013-10-22 MED ORDER — FUROSEMIDE 40 MG PO TABS
40.0000 mg | ORAL_TABLET | Freq: Every day | ORAL | Status: DC
  Administered 2013-10-23: 40 mg via ORAL
  Filled 2013-10-22: qty 1

## 2013-10-22 MED ORDER — ENSURE COMPLETE PO LIQD
237.0000 mL | Freq: Two times a day (BID) | ORAL | Status: DC
  Administered 2013-10-22 – 2013-10-23 (×2): 237 mL via ORAL

## 2013-10-22 NOTE — Progress Notes (Signed)
[facility] PROGRESS NOTE  Jeffrey [name]:[id number] DOB: April 15, 1918 DOA: 10/21/2013 PCP: [name], PA-C   Brief Narrative: Jeffrey [name] is a 80 y.o. male with history of COPD not on home oxygen, ongoing tobacco abuse, CAD, NSTEMI 2010 felt due to A. fib with RVR in the setting of multivessel CAD, not surgical candidate, moderate MR, PAF, HTN, NSVT, chronic anemia, HLD and PAD presented to the ED on 10/21/13 with complaints of worsening dyspnea and chest x-ray suggestive of diffuse pulmonary edema with probable small pleural effusion seen by basilar atelectasis.   Assessment/Plan: Acute diastolic CHF:  Unclear precipitating factor-? Worsening anemia/COPD exacerbation. Improved this am.  Weight 63kg down from 64.8kg on admission. No intake or output documented. Continue  IV Lasix 40 mg every 12 hours. Recent echo 06/06/13 showed LVEF 50% and diastolic dysfunction. Cycle cardiac enzymes negative x3.  Await cardiology consult.  Monitor closely.  COPD exacerbation: His worsening dyspnea is probably a combination of acute diastolic CHF and COPD exacerbation. Improved this am. Oxygen saturation level >93% on room air.  Wean oxygen and continue bronchodilator nebulizations. Solu-Medrol discontinued after 3rd dose. In the absence of fever, leukocytosis a productive cough, will hold off on antibiotics.  Anemia, acute on chronic: No overt bleeding. FOBT by EDP negative. Hg stable. No s/sx overt bleeding.  transfuse if hemoglobin less than 7 g per DL. Tobacco abuse: Cessation counseled.  CAD-multivessel disease/PAF/moderate MR/h/o NSTEMI: Not a surgical candidate. Currently in sinus rhythm. Continue beta blockers, Plavix. Troponin negative x3.  EDP discussed patient's care with cardiologist on call. Hypertension: Controlled   Code Status: full Family Communication: none present Disposition Plan: home when  ready   Consultants:  cardiology  Procedures:  none  Antibiotics:  none  HPI/Subjective: Sitting up in bed. Reports feeling better "than when i came in". Denies pain/discomfort  Objective: Filed Vitals:   10/22/13 0951  BP: 116/60  Pulse: 78  Temp:   Resp:     Intake/Output Summary (Last 24 hours) at 10/22/13 1015 Last data filed at 10/21/13 1930  Gross per 24 hour  Intake    240 ml  Output      0 ml  Net    240 ml   Filed Weights   10/21/13 1050 10/21/13 1726 10/22/13 0424  Weight: 58.514 kg (129 lb) 64.8 kg (142 lb 13.7 oz) 63 kg (138 lb 14.2 oz)    Exam:   General:  Thin somewhat frail  Cardiovascular: RRR +murmur no gallup or rub. No LE edema  Respiratory: normal effort BS distant. Fine crackles bilateral bases no wheeze   Abdomen: flat soft +BS non-tender to palpation   Musculoskeletal: no clubbing or cyanosis   Data Reviewed: Basic Metabolic Panel:  Recent Labs Lab 10/21/13 1108 10/22/13 0537  NA 141 142  K 3.8 4.3  CL 104 104  CO2 25 29  GLUCOSE 103* 161*  BUN 27* 27*  CREATININE 1.18 1.24  CALCIUM 8.7 8.6   Liver Function Tests: No results found for this basename: AST, ALT, ALKPHOS, BILITOT, PROT, ALBUMIN,  in the last 168 hours No results found for this basename: LIPASE, AMYLASE,  in the last 168 hours No results found for this basename: AMMONIA,  in the last 168 hours CBC:  Recent Labs Lab 10/21/13 1108 10/22/13 0537  WBC 4.1 2.3*  NEUTROABS 2.4  --   HGB 7.7* 7.7*  HCT 23.9* 23.3*  MCV 90.2 88.3  PLT 189 186   Cardiac Enzymes:  Recent Labs  Lab 10/21/13 1108 10/21/13 1736 10/22/13 0029 10/22/13 0537  TROPONINI <0.30 <0.30 <0.30 <0.30   BNP (last 3 results)  Recent Labs  10/21/13 1144  PROBNP 4163.0*   CBG: No results found for this basename: GLUCAP,  in the last 168 hours  No results found for this or any previous visit (from the past 240 hour(s)).   Studies: X-ray Chest Pa And Lateral   10/22/2013    CLINICAL DATA:  CHF follow up.  EXAM: CHEST  2 VIEW  COMPARISON:  DG CHEST 1V PORT dated 10/21/2013  FINDINGS: There is prominence of the interstitial markings and mild peribronchial cuffing. When compared to previous study these findings have decreased in conspicuity. Cardiac silhouette is enlarged. Atherosclerotic calcifications identified within the aorta. Blunting of the costophrenic angles appreciated as well as mild increased density within the lung bases. No focal regions consolidation. The osseous structures are unremarkable.  IMPRESSION: Improved pulmonary edema, with mild residua. No new focal regions of consolidation or new focal infiltrates.   Electronically Signed   By: Salome HolmesHector  Cooper M.D.   On: 10/22/2013 09:09   Dg Chest Portable 1 View  10/21/2013   CLINICAL DATA:  Shortness of breath.  Cough.  Chest congestion.  EXAM: PORTABLE CHEST - 1 VIEW  COMPARISON:  06/04/2013  FINDINGS: Cardiomegaly again demonstrated. Diffuse bilateral interstitial infiltrates are seen, consistent diffuse pulmonary edema. Probable small bilateral pleural effusions and bibasilar atelectasis also noted.  IMPRESSION: Diffuse pulmonary edema with probable small pleural effusions and bibasilar atelectasis.   Electronically Signed   By: Myles RosenthalJohn  Stahl M.D.   On: 10/21/2013 11:40    Scheduled Meds: . antiseptic oral rinse  15 mL Mouth Rinse BID  . atorvastatin  40 mg Oral QPM  . clopidogrel  75 mg Oral Daily  . enoxaparin (LOVENOX) injection  40 mg Subcutaneous Q24H  . [START ON 10/23/2013] furosemide  40 mg Oral Daily  . guaiFENesin  600 mg Oral BID  . influenza vac split quadrivalent PF  0.5 mL Intramuscular Tomorrow-1000  . ipratropium-albuterol  3 mL Nebulization Q6H  . metoprolol succinate  25 mg Oral Daily  . pantoprazole  40 mg Oral Daily  . sodium chloride  3 mL Intravenous Q12H   Continuous Infusions:   Principal Problem:   Diastolic CHF, acute Active Problems:   HYPERLIPIDEMIA   ANEMIA, NORMOCYTIC    SMOKER   CAD   PAROXYSMAL ATRIAL FIBRILLATION   PVD   Mitral valvular regurgitation   Hypertension   COPD exacerbation    Time spent: 30 minutes    Professional Eye Associates IncBLACK,Jasa Dundon M  [facility] Pager [phone]. If 7PM-7AM, please contact night-coverage at [email or web address], password [email or web address] 10/22/2013, 10:15 AM  LOS: 1 day

## 2013-10-22 NOTE — Progress Notes (Signed)
UR chart review completed.  

## 2013-10-22 NOTE — Progress Notes (Signed)
INITIAL NUTRITION ASSESSMENT  DOCUMENTATION CODES Per approved criteria  -Severe malnutrition in the context of chronic illness   INTERVENTION: Ensure Complete po BID, each supplement provides 350 kcal and 13 grams of protein. Downgrade diet consistency to dysphagia 2 with chopped meats for ease of intake.   NUTRITION DIAGNOSIS: Inadequate oral intake related to decreased appetite as evidenced by hx weight loss, fat and muscle depletion.    Goal: Jeffrey [name] will meet >90% of estimated nutritional needs  Monitor:  PO intake, skin assessments, labs, weight changes, I/O's  Reason for Assessment: MST=3  80 y.o. male  Admitting Dx: Diastolic CHF, acute  ASSESSMENT: Jeffrey [name] admitted for CHF exacerbation, edema, and SOB. He reports good appetite PTA, b but poor since hospitalization, although her reports improvement since admission. Visited Jeffrey [name] during lunch time and noted had eaten nearly 100% of lunch time meal.  Hx provided by Jeffrey [name] and significant other. They both confirm that Jeffrey [name] has lost weight. COnfirms UBW of 150#, which he reports he last weighted about 1 year ago. Wt hx reveals a 3# (2.2%) wt gain x 1 year and a 10# (7.8%) wt gain x 6 months, neither of which are clinically significant, but desirable given hx of weight loss. He reports that he drinks 1 can of Ensure daily at home and is agreeable to drinking Ensure during admission as well. Educated Jeffrey [name] and caregiver on importance of good PO intake for weight maintenance as well as importance of continuing supplements at home, to which they verbalized good understanding.  He reports chewing difficulty due to minimal teeth and is agreeable to diet downgrade of chopped meats for ease of intake  Nutrition Focused Physical Exam:  Subcutaneous Fat:  Orbital Region: severe depletion  Upper Arm Region: severe depletion Thoracic and Lumbar Region: severe depletion  Muscle:  Temple Region: severe depletion Clavicle Bone Region: severe  depletion Clavicle and Acromion Bone Region: severe depletion Scapular Bone Region: severe depletion Dorsal Hand: severe depletion Patellar Region: severe depletion Anterior Thigh Region: severe depletion Posterior Calf Region: severe depletion  Edema: none present  Jeffrey [name] meets criteria for severe MALNUTRITION in the context of chronic illness as evidenced by severe muscle and fat depletion.   Height: Ht Readings from Last 1 Encounters:  10/21/13 5\' 11"  (1.803 m)    Weight: Wt Readings from Last 1 Encounters:  10/22/13 138 lb 14.2 oz (63 kg)    Ideal Body Weight: 172#  % Ideal Body Weight: 80%  Wt Readings from Last 10 Encounters:  10/22/13 138 lb 14.2 oz (63 kg)  08/27/13 129 lb (58.514 kg)  08/20/13 137 lb (62.143 kg)  06/22/13 136 lb (61.689 kg)  06/08/13 136 lb 11 oz (62 kg)  06/08/13 136 lb 11 oz (62 kg)  05/24/13 126 lb (57.153 kg)  05/24/13 125 lb (56.7 kg)  03/28/13 128 lb (58.06 kg)  02/28/13 190 lb (86.183 kg)    Usual Body Weight: 155#  % Usual Body Weight: 89%  BMI:  Body mass index is 19.38 kg/(m^2). Meets criteria for normal weight.   Estimated Nutritional Needs: Kcal: 1300-1400 daily Protein: 63-79 grams daily Fluid: 1.3-1.4 L daily  Skin: WDL  Diet Order: Cardiac  EDUCATION NEEDS: -Education needs addressed   Intake/Output Summary (Last 24 hours) at 10/22/13 1350 Last data filed at 10/21/13 1930  Gross per 24 hour  Intake    240 ml  Output      0 ml  Net    240 ml    Last BM:  10/21/13   Labs:   Recent Labs Lab 10/21/13 1108 10/22/13 0537  NA 141 142  K 3.8 4.3  CL 104 104  CO2 25 29  BUN 27* 27*  CREATININE 1.18 1.24  CALCIUM 8.7 8.6  GLUCOSE 103* 161*    CBG (last 3)  No results found for this basename: GLUCAP,  in the last 72 hours  Scheduled Meds: . antiseptic oral rinse  15 mL Mouth Rinse BID  . atorvastatin  40 mg Oral QPM  . clopidogrel  75 mg Oral Daily  . enoxaparin (LOVENOX) injection  40 mg Subcutaneous  Q24H  . [START ON 10/23/2013] furosemide  40 mg Oral Daily  . guaiFENesin  600 mg Oral BID  . influenza vac split quadrivalent PF  0.5 mL Intramuscular Tomorrow-1000  . ipratropium-albuterol  3 mL Nebulization Q6H  . metoprolol succinate  25 mg Oral Daily  . pantoprazole  40 mg Oral Daily  . sodium chloride  3 mL Intravenous Q12H    Continuous Infusions:   Past Medical History  Diagnosis Date  . CAD (coronary artery disease)     a. NSTEMI 2010 felt due to AF-RVR in setting of multivessel CAD, not surgical candidate. b. NSTEMI trop >20, 05/2013 - 3v CAD incl LM - for med rx.  . MI, old   . Mitral valvular regurgitation     a. Mod by echo 10/20114.  . Atrial fibrillation   . Hypertension   . Syncope     a. 05/2013 admission: felt due to NSTEMI vs orthostasis.  Marland Kitchen NSVT (nonsustained ventricular tachycardia)     a. 05/2013 admission on tele. No sustained arrhythmias.  . Severe protein-calorie malnutrition   . Chronic anemia     a. s/p transfusion 05/2013.  . Orthostatic hypotension     a. During 05/2013 admission (BP 147->84 lying to sitting w/ associated dizziness).  . Paroxysmal a-fib   . COPD (chronic obstructive pulmonary disease)   . Hyperlipidemia   . PAD (peripheral artery disease)     Past Surgical History  Procedure Laterality Date  . Coronary angioplasty with stent placement      Mckenzye Cutright A. Mayford Knife, RD, LDN Pager: [phone]

## 2013-10-22 NOTE — Progress Notes (Signed)
Patient was interviewed and examined. Discussed with Ms. Toya SmothersKaren Black, NP-agree with her progress note with changes/addendum as follows. Chart reviewed in detail.  Patient feels significantly better with much improvement in dyspnea. Denies chest pain. Seen this morning sitting up in bed comfortably eating breakfast.  Patient received a dose of IV Lasix this morning and since he has clinically made significant improvement, will DC p.m. dose of Lasix and switch to oral Lasix beginning 2/24. His creatinine also has risen slightly. Less convinced about COPD exacerbation-most of his presentation is probably secondary to decompensated CHF. DC steroids. Continue nebulizations. Since the patient has made significant improvement, will hold off on cardiology consultation. Anemia is stable but also has mild leukopenia. We'll check anemia panel.  Marcellus ScottHONGALGI,Marilu Rylander, MD, FACP, FHM. [facility] Pager [phone]  If 7PM-7AM, please contact night-coverage [email or web address] Password [email or web address] 10/22/2013, 5:24 PM

## 2013-10-22 NOTE — Evaluation (Signed)
Physical Therapy Evaluation Patient Details Name: Jeffrey [name] MRN: [id number] DOB: 05/10/1918 Today's Date: 10/22/2013 Time: 1540-1600 PT Time Calculation (min): 20 min  PT Assessment / Plan / Recommendation History of Present Illness  Jeffrey [name] is a 80 y.o. male with history of COPD not on home oxygen, ongoing tobacco abuse, CAD, NSTEMI 2010 felt due to A. fib with RVR in the setting of multivessel CAD, not surgical candidate, moderate MR, PAF, HTN, NSVT, chronic anemia, HLD and PAD presented to the ED on 10/21/13 with complaints of worsening dyspnea. He gives a one-week history of progressively worsening dyspnea & PND but denies of orthopnea, chest pain or worsening leg edema  Clinical Impression  Pt is a 80 yo male who does not complain of SOB with walking but does does complain of pain in his calves when walking.  Pt O2 stat does decrease significantly when he ambulates although functionally the pain appears to be more limiting than his O2.  Pt lives in a small apartment and should be functional at home.    PT Assessment  Patient needs continued PT services    Follow Up Recommendations  No PT follow up    Does the patient have the potential to tolerate intense rehabilitation    n/a  Barriers to Discharge  lives alone      Equipment Recommendations  None recommended by PT    Recommendations for Other Services  none   Frequency Min 3X/week    Precautions / Restrictions  O2 stat  Pertinent Vitals/Pain 3/10      Mobility  Bed Mobility Overal bed mobility: Independent Transfers Overall transfer level: Modified independent Ambulation/Gait Ambulation/Gait assistance: Modified independent (Device/Increase time) Ambulation Distance (Feet): 320 Feet Assistive device: Rolling walker (2 wheeled) Gait Pattern/deviations: WFL(Within Functional Limits) Gait velocity interpretation: at or above normal speed for age/gender General Gait Details: prior to ambulation O2  was at 96%; after ambulation O2 had decreased to 79%; after one minute recovery O2 was at 90%     Exercises  none completed today.   PT Diagnosis: Difficulty walking  PT Problem List: Decreased activity tolerance;Pain (Pt has increased pain in B LE when walking.) PT Treatment Interventions: Gait training;Patient/family education     PT Goals(Current goals can be found in the care plan section) Acute Rehab PT Goals PT Goal Formulation: With patient Time For Goal Achievement: 10/26/13 Potential to Achieve Goals: Good  Visit Information  Last PT Received On: 10/22/13 History of Present Illness: Jeffrey [name] is a 80 y.o. male with history of COPD not on home oxygen, ongoing tobacco abuse, CAD, NSTEMI 2010 felt due to A. fib with RVR in the setting of multivessel CAD, not surgical candidate, moderate MR, PAF, HTN, NSVT, chronic anemia, HLD and PAD presented to the ED on 10/21/13 with complaints of worsening dyspnea. He gives a one-week history of progressively worsening dyspnea & PND but denies of orthopnea, chest pain or worsening leg edema       Prior Functioning  Home Living Family/patient expects to be discharged to:: Private residence Living Arrangements: Alone Available Help at Discharge: Friend(s) Type of Home: Apartment Home Access: Level entry Home Layout: One level Home Equipment: Environmental consultantWalker - 2 wheels;Cane - single point Prior Function Level of Independence: Independent with assistive device(s) Communication Communication: No difficulties Dominant Hand: Right    Cognition  Cognition Arousal/Alertness: Awake/alert Overall Cognitive Status: Within Functional Limits for tasks assessed    Extremity/Trunk Assessment Lower Extremity Assessment Lower Extremity Assessment: Overall  WFL for tasks assessed   Balance Balance Overall balance assessment: Needs assistance Sitting-balance support: Bilateral upper extremity supported  End of Session PT - End of Session Equipment  Utilized During Treatment: Gait belt Patient left: in chair;with call bell/phone within reach;with chair alarm set  GP     Jeffrey [name],Jeffrey [name] 10/22/2013, 4:08 PM

## 2013-10-23 LAB — CBC
HCT: 25.2 % — ABNORMAL LOW (ref 39.0–52.0)
Hemoglobin: 8.1 g/dL — ABNORMAL LOW (ref 13.0–17.0)
MCH: 28.6 pg (ref 26.0–34.0)
MCHC: 32.1 g/dL (ref 30.0–36.0)
MCV: 89 fL (ref 78.0–100.0)
PLATELETS: 209 10*3/uL (ref 150–400)
RBC: 2.83 MIL/uL — AB (ref 4.22–5.81)
RDW: 13.9 % (ref 11.5–15.5)
WBC: 8.2 10*3/uL (ref 4.0–10.5)

## 2013-10-23 LAB — BASIC METABOLIC PANEL
BUN: 31 mg/dL — AB (ref 6–23)
CALCIUM: 8.9 mg/dL (ref 8.4–10.5)
CO2: 30 mEq/L (ref 19–32)
Chloride: 102 mEq/L (ref 96–112)
Creatinine, Ser: 1.23 mg/dL (ref 0.50–1.35)
GFR calc Af Amer: 57 mL/min — ABNORMAL LOW (ref >90)
GFR, EST NON AFRICAN AMERICAN: 49 mL/min — AB (ref >90)
Glucose, Bld: 108 mg/dL — ABNORMAL HIGH (ref 70–99)
Potassium: 4.3 mEq/L (ref 3.7–5.3)
SODIUM: 141 meq/L (ref 137–147)

## 2013-10-23 MED ORDER — IPRATROPIUM-ALBUTEROL 0.5-2.5 (3) MG/3ML IN SOLN
3.0000 mL | Freq: Three times a day (TID) | RESPIRATORY_TRACT | Status: DC
  Administered 2013-10-23 (×2): 3 mL via RESPIRATORY_TRACT
  Filled 2013-10-23 (×2): qty 3

## 2013-10-23 MED ORDER — LIVING BETTER WITH HEART FAILURE BOOK
Freq: Once | Status: AC
  Administered 2013-10-23: 16:00:00
  Filled 2013-10-23: qty 1

## 2013-10-23 MED ORDER — FUROSEMIDE 40 MG PO TABS: 40.0000 mg | ORAL_TABLET | Freq: Every day | ORAL | Status: DC

## 2013-10-23 MED ORDER — IPRATROPIUM-ALBUTEROL 18-103 MCG/ACT IN AERO: 1.0000 | INHALATION_SPRAY | Freq: Four times a day (QID) | RESPIRATORY_TRACT | Status: DC | PRN

## 2013-10-23 NOTE — Care Management Note (Addendum)
    Page 1 of 1   10/23/2013     3:56:20 PM   CARE MANAGEMENT NOTE 10/23/2013  Patient:  Jeffrey [name],Jeffrey [name]   Account Number:  [id number]  Date Initiated:  10/23/2013  Documentation initiated by:  Sharrie RothmanBLACKWELL,Demar Shad C  Subjective/Objective Assessment:   Pt admitted from home with CHF/COPD. Pt lives alone and has [name] friend that comes daily to cook for him. Pt is fairly independent with ADL's. Pt has [name] cane for prn use. Pt stated that he just stopped driving recently.     Action/Plan:   WIll continue to follow for discharge planning needs. Pt may need home O2.   Anticipated DC Date:  10/24/2013   Anticipated DC Plan:  HOME/SELF CARE      DC Planning Services  CM consult      Choice offered to / List presented to:             Status of service:  Completed, signed off Medicare Important Message given?  YES (If response is "NO", the following Medicare IM given date fields will be blank) Date Medicare IM given:  10/23/2013 Date Additional Medicare IM given:    Discharge Disposition:  HOME/SELF CARE  Per UR Regulation:    If discussed at Long Length of Stay Meetings, dates discussed:    Comments:  10/23/13 1555 Arlyss Queenammy Linkon Siverson, RN BSN CM Pt does not qualify for home Ow2. No other CM needs noted. Pt discharged home today.  10/23/13 1420 Arlyss Queenammy Axiel Fjeld, RN BSN CM

## 2013-10-23 NOTE — Discharge Instructions (Signed)
Heart Failure °Heart failure is a condition in which the heart has trouble pumping blood. This means your heart does not pump blood efficiently for your body to work well. In some cases of heart failure, fluid may back up into your lungs or you may have swelling (edema) in your lower legs. Heart failure is usually a long-term (chronic) condition. It is important for you to take good care of yourself and follow your caregiver's treatment plan. °CAUSES  °Some health conditions can cause heart failure. Those health conditions include: °· High blood pressure (hypertension) causes the heart muscle to work harder than normal. When pressure in the blood vessels is high, the heart needs to pump (contract) with more force in order to circulate blood throughout the body. High blood pressure eventually causes the heart to become stiff and weak. °· Coronary artery disease (CAD) is the buildup of cholesterol and fat (plaque) in the arteries of the heart. The blockage in the arteries deprives the heart muscle of oxygen and blood. This can cause chest pain and may lead to a heart attack. High blood pressure can also contribute to CAD. °· Heart attack (myocardial infarction) occurs when 1 or more arteries in the heart become blocked. The loss of oxygen damages the muscle tissue of the heart. When this happens, part of the heart muscle dies. The injured tissue does not contract as well and weakens the heart's ability to pump blood. °· Abnormal heart valves can cause heart failure when the heart valves do not open and close properly. This makes the heart muscle pump harder to keep the blood flowing. °· Heart muscle disease (cardiomyopathy or myocarditis) is damage to the heart muscle from a variety of causes. These can include drug or alcohol abuse, infections, or unknown reasons. These can increase the risk of heart failure. °· Lung disease makes the heart work harder because the lungs do not work properly. This can cause a strain  on the heart, leading it to fail. °· Diabetes increases the risk of heart failure. High blood sugar contributes to high fat (lipid) levels in the blood. Diabetes can also cause slow damage to tiny blood vessels that carry important nutrients to the heart muscle. When the heart does not get enough oxygen and food, it can cause the heart to become weak and stiff. This leads to a heart that does not contract efficiently. °· Other conditions can contribute to heart failure. These include abnormal heart rhythms, thyroid problems, and low blood counts (anemia). °Certain unhealthy behaviors can increase the risk of heart failure. Those unhealthy behaviors include: °· Being overweight. °· Smoking or chewing tobacco. °· Eating foods high in fat and cholesterol. °· Abusing illicit drugs or alcohol. °· Lacking physical activity. °SYMPTOMS  °Heart failure symptoms may vary and can be hard to detect. Symptoms may include: °· Shortness of breath with activity, such as climbing stairs. °· Persistent cough. °· Swelling of the feet, ankles, legs, or abdomen. °· Unexplained weight gain. °· Difficulty breathing when lying flat (orthopnea). °· Waking from sleep because of the need to sit up and get more air. °· Rapid heartbeat. °· Fatigue and loss of energy. °· Feeling lightheaded, dizzy, or close to fainting. °· Loss of appetite. °· Nausea. °· Increased urination during the night (nocturia). °DIAGNOSIS  °A diagnosis of heart failure is based on your history, symptoms, physical examination, and diagnostic tests. °Diagnostic tests for heart failure may include: °· Echocardiography. °· Electrocardiography. °· Chest X-ray. °· Blood tests. °· Exercise   stress test. °· Cardiac angiography. °· Radionuclide scans. °TREATMENT  °Treatment is aimed at managing the symptoms of heart failure. Medicines, behavioral changes, or surgical intervention may be necessary to treat heart failure. °· Medicines to help treat heart failure may  include: °· Angiotensin-converting enzyme (ACE) inhibitors. This type of medicine blocks the effects of a blood protein called angiotensin-converting enzyme. ACE inhibitors relax (dilate) the blood vessels and help lower blood pressure. °· Angiotensin receptor blockers. This type of medicine blocks the actions of a blood protein called angiotensin. Angiotensin receptor blockers dilate the blood vessels and help lower blood pressure. °· Water pills (diuretics). Diuretics cause the kidneys to remove salt and water from the blood. The extra fluid is removed through urination. This loss of extra fluid lowers the volume of blood the heart pumps. °· Beta blockers. These prevent the heart from beating too fast and improve heart muscle strength. °· Digitalis. This increases the force of the heartbeat. °· Healthy behavior changes include: °· Obtaining and maintaining a healthy weight. °· Stopping smoking or chewing tobacco. °· Eating heart healthy foods. °· Limiting or avoiding alcohol. °· Stopping illicit drug use. °· Physical activity as directed by your caregiver. °· Surgical treatment for heart failure may include: °· A procedure to open blocked arteries, repair damaged heart valves, or remove damaged heart muscle tissue. °· A pacemaker to improve heart muscle function and control certain abnormal heart rhythms. °· An internal cardioverter defibrillator to treat certain serious abnormal heart rhythms. °· A left ventricular assist device to assist the pumping ability of the heart. °HOME CARE INSTRUCTIONS  °· Take your medicine as directed by your caregiver. Medicines are important in reducing the workload of your heart, slowing the progression of heart failure, and improving your symptoms. °· Do not stop taking your medicine unless directed by your caregiver. °· Do not skip any dose of medicine. °· Refill your prescriptions before you run out of medicine. Your medicines are needed every day. °· Take over-the-counter  medicine only as directed by your caregiver or pharmacist. °· Engage in moderate physical activity if directed by your caregiver. Moderate physical activity can benefit some people. The elderly and people with severe heart failure should consult with a caregiver for physical activity recommendations. °· Eat heart healthy foods. Food choices should be free of trans fat and low in saturated fat, cholesterol, and salt (sodium). Healthy choices include fresh or frozen fruits and vegetables, fish, lean meats, legumes, fat-free or low-fat dairy products, and whole grain or high fiber foods. Talk to a dietitian to learn more about heart healthy foods. °· Limit sodium if directed by your caregiver. Sodium restriction may reduce symptoms of heart failure in some people. Talk to a dietitian to learn more about heart healthy seasonings. °· Use healthy cooking methods. Healthy cooking methods include roasting, grilling, broiling, baking, poaching, steaming, or stir-frying. Talk to a dietitian to learn more about healthy cooking methods. °· Limit fluids if directed by your caregiver. Fluid restriction may reduce symptoms of heart failure in some people. °· Weigh yourself every day. Daily weights are important in the early recognition of excess fluid. You should weigh yourself every morning after you urinate and before you eat breakfast. Wear the same amount of clothing each time you weigh yourself. Record your daily weight. Provide your caregiver with your weight record. °· Monitor and record your blood pressure if directed by your caregiver. °· Check your pulse if directed by your caregiver. °· Lose weight if directed   by your caregiver. Weight loss may reduce symptoms of heart failure in some people. °· Stop smoking or chewing tobacco. Nicotine makes your heart work harder by causing your blood vessels to constrict. Do not use nicotine gum or patches before talking to your caregiver. °· Schedule and attend follow-up visits as  directed by your caregiver. It is important to keep all your appointments. °· Limit alcohol intake to no more than 1 drink per day for nonpregnant women and 2 drinks per day for men. Drinking more than that is harmful to your heart. Tell your caregiver if you drink alcohol several times a week. Talk with your caregiver about whether alcohol is safe for you. If your heart has already been damaged by alcohol or you have severe heart failure, drinking alcohol should be stopped completely. °· Stop illicit drug use. °· Stay up-to-date with immunizations. It is especially important to prevent respiratory infections through current pneumococcal and influenza immunizations. °· Manage other health conditions such as hypertension, diabetes, thyroid disease, or abnormal heart rhythms as directed by your caregiver. °· Learn to manage stress. °· Plan rest periods when fatigued. °· Learn strategies to manage high temperatures. If the weather is extremely hot: °· Avoid vigorous physical activity. °· Use air conditioning or fans or seek a cooler location. °· Avoid caffeine and alcohol. °· Wear loose-fitting, lightweight, and light-colored clothing. °· Learn strategies to manage cold temperatures. If the weather is extremely cold: °· Avoid vigorous physical activity. °· Layer clothes. °· Wear mittens or gloves, a hat, and a scarf when going outside. °· Avoid alcohol. °· Obtain ongoing education and support as needed. °· Participate or seek rehabilitation as needed to maintain or improve independence and quality of life. °SEEK MEDICAL CARE IF:  °· Your weight increases by 03 lb/1.4 kg in 1 day or 05 lb/2.3 kg in a week. °· You have increasing shortness of breath that is unusual for you. °· You are unable to participate in your usual physical activities. °· You tire easily. °· You cough more than normal, especially with physical activity. °· You have any or more swelling in areas such as your hands, feet, ankles, or abdomen. °· You  are unable to sleep because it is hard to breathe. °· You feel like your heart is beating fast (palpitations). °· You become dizzy or lightheaded upon standing up. °SEEK IMMEDIATE MEDICAL CARE IF:  °· You have difficulty breathing. °· There is a change in mental status such as decreased alertness or difficulty with concentration. °· You have a pain or discomfort in your chest. °· You have an episode of fainting (syncope). °MAKE SURE YOU:  °· Understand these instructions. °· Will watch your condition. °· Will get help right away if you are not doing well or get worse. °Document Released: 08/16/2005 Document Revised: 12/11/2012 Document Reviewed: 09/07/2012 °[employer]® Patient Information ©2014 [employer]. ° °

## 2013-10-23 NOTE — Progress Notes (Addendum)
Physical Therapy Treatment Patient Details Name: Mackie PaiGeorge A Carignan MRN: [id number] DOB: 06/08/1918 Today's Date: 10/23/2013 Time: [id number] PT Time Calculation (min): 45 min  PT Assessment / Plan / Recommendation  PT Comments   Pt completes bed exercises well with minimal difficulty after initial cueing and demo. Before standing O2 saturation is 95% on room air. Completed gait training for 100 feet on room air. O2 saturation drops to 79% with ambulation. Saturation increases to low 90's with standing rest breaks. Once resting in chair O2 returns to 95%.  Plan Current plan remains appropriate    Pertinent Vitals/Pain No pain.    Mobility  Bed Mobility Overal bed mobility: Independent Transfers Overall transfer level: Modified independent Ambulation/Gait Ambulation/Gait assistance: Modified independent (Device/Increase time) Ambulation Distance (Feet): 100 Feet Assistive device: Rolling walker (2 wheeled) General Gait Details: Gait training completed on RA. O2 drops to 79% with ambulation. Saturation increases back to low 90's with standing rest breaks.     Exercises General Exercises - Lower Extremity Ankle Circles/Pumps: Both;10 reps;Supine Quad Sets: Both;10 reps;Supine Heel Slides: Both;10 reps;Supine Hip ABduction/ADduction: Both;10 reps;Supine    Visit Information  Last PT Received On: 10/23/13    Subjective Data  "I feel pretty good."  End of Session PT - End of Session Equipment Utilized During Treatment: Gait belt Patient left: in chair;with call bell/phone within reach;with chair alarm set   Seth Bakeebekah Nevena Rozenberg, PTA  10/23/2013, 10:41 AM

## 2013-10-23 NOTE — Progress Notes (Signed)
Patient with orders to be discharge home. Discharge instructions given, patient verbalized understanding. Patient stable. Patient left in private vehicle with friend.  

## 2013-10-23 NOTE — Progress Notes (Signed)
Patient )2 sats 98% room air, ambulated patient O2 sats drop to 92% on room air. No need for oxygen.

## 2013-10-23 NOTE — Discharge Summary (Addendum)
Physician Discharge Summary  Mackie PaiGeorge A Netherland [id number] DOB: 05/04/1918 DOA: 10/21/2013  PCP: Frazier RichardsIXON,MARY BETH, PA-C  Admit date: 10/21/2013 Discharge date: 10/23/2013  Time spent: Less than 30 minutes  Recommendations for Outpatient Follow-up:  1. Ms. Frazier RichardsMary Beth Dixon, PA-C/PCP in 1 week with repeat labs (CBC & BMP). 2. Dr. Prentice DockerSuresh Koneswaran, Cardiology in 1 week.  Discharge Diagnoses:  Principal Problem:   Diastolic CHF, acute Active Problems:   HYPERLIPIDEMIA   ANEMIA, NORMOCYTIC   SMOKER   CAD   PAROXYSMAL ATRIAL FIBRILLATION   PVD   Mitral valvular regurgitation   Hypertension   COPD exacerbation   Discharge Condition: Improved & Stable  Diet recommendation: Heart healthy diet.  Filed Weights   10/21/13 1726 10/22/13 0424 10/23/13 0500  Weight: 64.8 kg (142 lb 13.7 oz) 63 kg (138 lb 14.2 oz) 63.8 kg (140 lb 10.5 oz)    History of present illness:  80 year old male patient with history of severe LMCA and three-vessel CAD with low normal LV systolic function, prior NSTEMI 2010 felt due to A. fib with RVR, unsuitable for PCI and very high risk for complications for CABG surgery due to advanced age, presyncope, mitral regurgitation, hyperlipidemia, paroxysmal atrial fibrillation, PAD, COPD not on home oxygen, ongoing tobacco abuse, HTN, NSVT, chronic anemia was admitted on 10/21/13 with one week history of progressively worsening dyspnea and PND. He claimed compliance with his medications and salt restricted diet. He was not on diuretics at home. He continued to smoke cigarettes. In the ED, hemoglobin 7.7 down from 10.7 on 08/27/13, proBNP 4163, troponin x1 negative, chest x-ray suggestive of diffuse pulmonary edema with probable small pleural effusion seen by basilar atelectasis.   Hospital Course:   1. Acute diastolic CHF: Unclear precipitating factor-? Worsening anemia/COPD exacerbation. He was admitted and briefly treated with IV Lasix with prompt improvement. He has  been transitioned to oral Lasix. Continue beta blockers. ACE inhibitors or ARB have not been started secondary to stage II chronic kidney disease but may be considered during outpatient followup. Recent echo 06/06/13 showed LVEF 50% and diastolic dysfunction. Cardiac enzymes were cycled and negative.  2. COPD exacerbation: His worsening dyspnea was probably more due to decompensated CHF than COPD exacerbation. He was briefly placed on IV Solu-Medrol which was discontinued the next day of admission. He was ambulated and is not hypoxic on room air -at rest or with activity. Tobacco cessation has been counseled repeatedly. 3. Anemia, acute on chronic: No overt bleeding. FOBT by EDP negative. Hemoglobin stable. Close followup of CBC as outpatient. Given advanced age and frail overall status, not sure how aggressive we need to be in terms of evaluating his anemia-defer to outpatient PCP.  4. Tobacco abuse: Cessation counseled. 5. CAD-multivessel disease/PAF/moderate MR/h/o NSTEMI: Not a surgical candidate. Currently in sinus rhythm. Continue beta blockers, Plavix and statins. 6. Hypertension: Controlled   Consultations:  None  Procedures:  None    Discharge Exam:  Complaints:  Patient denies dyspnea, even with activity. No chest pain or cough. Denies dizziness or lightheadedness.  Filed Vitals:   10/23/13 0500 10/23/13 0743 10/23/13 1418 10/23/13 1435  BP:   103/63   Pulse:   66   Temp:   97.9 F (36.6 C)   TempSrc:      Resp:   20   Height:      Weight: 63.8 kg (140 lb 10.5 oz)     SpO2:  96% 98% 96%    General exam: Elderly frail male sitting up  at edge of bed in no obvious distress Respiratory system: Distant breath sounds but apart from occasional basal crackles, clear to auscultation. Much improved compared to admission. No increased work of breathing. Cardiovascular system: S1 & S2 heard, RRR. No JVD, murmurs, gallops, clicks or pedal edema. Tele: SR with occasional PAC's &  Trigeminy. Gastrointestinal system: Abdomen is nondistended, soft and nontender. Normal bowel sounds heard. Central nervous system: Alert and oriented. No focal neurological deficits. Extremities: Symmetric 5 x 5 power.  Discharge Instructions      Discharge Orders   Future Appointments Provider Department Dept Phone   11/16/2013 1:00 PM Laqueta Linden, MD [facility] [phone]   Future Orders Complete By Expires   (HEART FAILURE PATIENTS) Call MD:  Anytime you have any of the following symptoms: 1) 3 pound weight gain in 24 hours or 5 pounds in 1 week 2) shortness of breath, with or without a dry hacking cough 3) swelling in the hands, feet or stomach 4) if you have to sleep on extra pillows at night in order to breathe.  As directed    Call MD for:  difficulty breathing, headache or visual disturbances  As directed    Call MD for:  severe uncontrolled pain  As directed    Diet - low sodium heart healthy  As directed    Increase activity slowly  As directed        Medication List         albuterol-ipratropium 18-103 MCG/ACT inhaler  Commonly known as:  COMBIVENT  Inhale 1-2 puffs into the lungs every 6 (six) hours as needed for wheezing or shortness of breath.     atorvastatin 40 MG tablet  Commonly known as:  LIPITOR  Take 1 tablet (40 mg total) by mouth every evening.     clopidogrel 75 MG tablet  Commonly known as:  PLAVIX  Take 1 tablet (75 mg total) by mouth daily.     furosemide 40 MG tablet  Commonly known as:  LASIX  Take 1 tablet (40 mg total) by mouth daily.     metoprolol succinate 25 MG 24 hr tablet  Commonly known as:  TOPROL-XL  Take 1 tablet (25 mg total) by mouth daily.     nitroGLYCERIN 0.4 MG SL tablet  Commonly known as:  NITROSTAT  Place 1 tablet (0.4 mg total) under the tongue every 5 (five) minutes as needed for chest pain (up to 3 doses).     pantoprazole 40 MG tablet  Commonly known as:  PROTONIX  Take 1 tablet (40 mg  total) by mouth daily.       Follow-up Information   Follow up with [facility] BETH, PA-C. Schedule an appointment as soon as possible for a visit in 1 week. (To be seen with repeat labs (CBC & BMP).)    Specialty:  Physician Assistant   Contact information:   [phone] [place] [address] [phone]       Follow up with Prentice Docker A, MD. Schedule an appointment as soon as possible for a visit in 1 week.   Specialty:  Cardiology   Contact information:   [address] [place] [address] [phone]        The results of significant diagnostics from this hospitalization (including imaging, microbiology, ancillary and laboratory) are listed below for reference.    Significant Diagnostic Studies: X-ray Chest Pa And Lateral   10/22/2013   CLINICAL DATA:  CHF follow  up.  EXAM: CHEST  2 VIEW  COMPARISON:  DG CHEST 1V PORT dated 10/21/2013  FINDINGS: There is prominence of the interstitial markings and mild peribronchial cuffing. When compared to previous study these findings have decreased in conspicuity. Cardiac silhouette is enlarged. Atherosclerotic calcifications identified within the aorta. Blunting of the costophrenic angles appreciated as well as mild increased density within the lung bases. No focal regions consolidation. The osseous structures are unremarkable.  IMPRESSION: Improved pulmonary edema, with mild residua. No new focal regions of consolidation or new focal infiltrates.   Electronically Signed   By: Salome Holmes M.D.   On: 10/22/2013 09:09   Dg Chest Portable 1 View  10/21/2013   CLINICAL DATA:  Shortness of breath.  Cough.  Chest congestion.  EXAM: PORTABLE CHEST - 1 VIEW  COMPARISON:  06/04/2013  FINDINGS: Cardiomegaly again demonstrated. Diffuse bilateral interstitial infiltrates are seen, consistent diffuse pulmonary edema. Probable small bilateral pleural effusions and bibasilar atelectasis also noted.  IMPRESSION: Diffuse pulmonary  edema with probable small pleural effusions and bibasilar atelectasis.   Electronically Signed   By: Myles Rosenthal M.D.   On: 10/21/2013 11:40    Microbiology: No results found for this or any previous visit (from the past 240 hour(s)).   Labs: Basic Metabolic Panel:  Recent Labs Lab 10/21/13 1108 10/22/13 0537 10/23/13 0615  NA 141 142 141  K 3.8 4.3 4.3  CL 104 104 102  CO2 25 29 30   GLUCOSE 103* 161* 108*  BUN 27* 27* 31*  CREATININE 1.18 1.24 1.23  CALCIUM 8.7 8.6 8.9   Liver Function Tests: No results found for this basename: AST, ALT, ALKPHOS, BILITOT, PROT, ALBUMIN,  in the last 168 hours No results found for this basename: LIPASE, AMYLASE,  in the last 168 hours No results found for this basename: AMMONIA,  in the last 168 hours CBC:  Recent Labs Lab 10/21/13 1108 10/22/13 0537 10/23/13 1417  WBC 4.1 2.3* 8.2  NEUTROABS 2.4  --   --   HGB 7.7* 7.7* 8.1*  HCT 23.9* 23.3* 25.2*  MCV 90.2 88.3 89.0  PLT 189 186 209   Cardiac Enzymes:  Recent Labs Lab 10/21/13 1108 10/21/13 1736 10/22/13 0029 10/22/13 0537  TROPONINI <0.30 <0.30 <0.30 <0.30   BNP: BNP (last 3 results)  Recent Labs  10/21/13 1144  PROBNP 4163.0*   CBG: No results found for this basename: GLUCAP,  in the last 168 hours  Signed:  Marcellus Scott, MD, FACP, FHM. [facility] Pager [phone]  If 7PM-7AM, please contact night-coverage [email or web address] Password [facility] 10/23/2013, 3:45 PM

## 2013-10-31 ENCOUNTER — Ambulatory Visit: Payer: Self-pay | Admitting: Physician Assistant

## 2013-10-31 ENCOUNTER — Encounter: Payer: Self-pay | Admitting: Physician Assistant

## 2013-10-31 ENCOUNTER — Ambulatory Visit (INDEPENDENT_AMBULATORY_CARE_PROVIDER_SITE_OTHER): Payer: Medicare Other | Admitting: Physician Assistant

## 2013-10-31 VITALS — HR 76 | Temp 97.5°F | Resp 18 | Ht 71.0 in | Wt 134.0 lb

## 2013-10-31 DIAGNOSIS — F17200 Nicotine dependence, unspecified, uncomplicated: Secondary | ICD-10-CM

## 2013-10-31 DIAGNOSIS — D649 Anemia, unspecified: Secondary | ICD-10-CM

## 2013-10-31 DIAGNOSIS — I340 Nonrheumatic mitral (valve) insufficiency: Secondary | ICD-10-CM

## 2013-10-31 DIAGNOSIS — I5031 Acute diastolic (congestive) heart failure: Secondary | ICD-10-CM

## 2013-10-31 DIAGNOSIS — I509 Heart failure, unspecified: Secondary | ICD-10-CM

## 2013-10-31 DIAGNOSIS — E43 Unspecified severe protein-calorie malnutrition: Secondary | ICD-10-CM

## 2013-10-31 DIAGNOSIS — I059 Rheumatic mitral valve disease, unspecified: Secondary | ICD-10-CM

## 2013-10-31 DIAGNOSIS — I2510 Atherosclerotic heart disease of native coronary artery without angina pectoris: Secondary | ICD-10-CM

## 2013-10-31 MED ORDER — FUROSEMIDE 20 MG PO TABS: 20.0000 mg | ORAL_TABLET | Freq: Every day | ORAL | Status: DC

## 2013-10-31 NOTE — Progress Notes (Signed)
Patient ID: Jeffrey [name] MRN: [id number], DOB: April 11, 1918, 80 y.o. Date of Encounter: @DATE @  Chief Complaint:  Chief Complaint  Patient presents with  . hospital follow up    HPI: 80 y.o. year old male  presents with his girlfriend-- for followup office visit after recent hospitalization.  He has a known history of severe left main coronary artery disease as well as three-vessel CAD and low normal LV systolic function. Has felt to be unsuitable for PCI and very high risk for CABG surgery. Therefore has been " medically treated".   Also has a history of mitral regurgitation, paroxysmal atrial fibrillation, hyperlipidemia, ongoing tobacco use, hypertension, NSVT, chronic anemia.  He was admitted on 10/21/13 with one week history of progressively worsening dyspnea and PND. He claimed compliance with his medications and to salt restricted diet. He was not on any diuretics at home prior to the hospitalization. Noted that he had continued to smoke cigarettes. In the ED, hemoglobin 7.7 which was down from 10.7 on 08/27/13. ProBNP 4163. Cardiac enzymes negative. Chest x-ray with diffuse pulmonary edema with probable small pleural effusion.  Hospitalization diagnosis was Acute Diastolic CHF: Unclear precipitating factor of possible worsening anemia/COPD exacerbation. Was  admitted and briefly treated with IV Lasix with prompt improvement. Was then transitioned to  oral Lasix. Continued on beta blockers. It was noted that he is on no ACE inhibitor or ARB secondary to stage 2 CKD but that we could consider starting this as outpatient.  He also had hospital diagnosis of Anemia, Acute on Chronic: No overt bleeding. FOBT by EDP negative. Hemoglobin stable. Plan close follow up a CBC as outpatient. Given advanced age and frail overall status, not sure how aggressive to be in terms of evaluating anemia and that they would defer this to outpatient PCP.  Discharge summary reported for him to followup  with me today and specifically for me to repeat labs including a CBC and BMP. Patient has followup appointment with cardiologist 3/ 20/15 at 1:00 PM  Today patient and his girlfriend state that he has been taking medications as directed at discharge. She does have  actual medication bottles with them and this does include frusemide 40 mg daily.  He has had no swelling of his feet ankles or legs since discharge.  He also reports that his breathing is at his usual baseline. Says that he is having no shortness of breath. Also no orthopnea and no PND. He does sleep in the bed not in a recliner et Karie Soda. Sleeps on one to 2 pillows just depending on comfort but not secondary to orthopnea. He has not had any cough or symptoms of chest congestion.   Past Medical History  Diagnosis Date  . CAD (coronary artery disease)     a. NSTEMI 2010 felt due to AF-RVR in setting of multivessel CAD, not surgical candidate. b. NSTEMI trop >20, 05/2013 - 3v CAD incl LM - for med rx.  . MI, old   . Mitral valvular regurgitation     a. Mod by echo 10/20114.  . Atrial fibrillation   . Hypertension   . Syncope     a. 05/2013 admission: felt due to NSTEMI vs orthostasis.  Marland Kitchen NSVT (nonsustained ventricular tachycardia)     a. 05/2013 admission on tele. No sustained arrhythmias.  . Severe protein-calorie malnutrition   . Chronic anemia     a. s/p transfusion 05/2013.  . Orthostatic hypotension     a. During 05/2013 admission (BP 147->84  lying to sitting w/ associated dizziness).  . Paroxysmal a-fib   . COPD (chronic obstructive pulmonary disease)   . Hyperlipidemia   . PAD (peripheral artery disease)      Home Meds: See attached medication section for current medication list. Any medications entered into computer today will not appear on this note's list. The medications listed below were entered prior to today. Current Outpatient Prescriptions on File Prior to Visit  Medication Sig Dispense Refill  .  albuterol-ipratropium (COMBIVENT) 18-103 MCG/ACT inhaler Inhale 1-2 puffs into the lungs every 6 (six) hours as needed for wheezing or shortness of breath.  1 Inhaler  0  . atorvastatin (LIPITOR) 40 MG tablet Take 1 tablet (40 mg total) by mouth every evening.  30 tablet  6  . clopidogrel (PLAVIX) 75 MG tablet Take 1 tablet (75 mg total) by mouth daily.  30 tablet  6  . metoprolol succinate (TOPROL-XL) 25 MG 24 hr tablet Take 1 tablet (25 mg total) by mouth daily.  30 tablet  6  . nitroGLYCERIN (NITROSTAT) 0.4 MG SL tablet Place 1 tablet (0.4 mg total) under the tongue every 5 (five) minutes as needed for chest pain (up to 3 doses).  25 tablet  3  . pantoprazole (PROTONIX) 40 MG tablet Take 1 tablet (40 mg total) by mouth daily.  30 tablet  2   No current facility-administered medications on file prior to visit.    Allergies: No Known Allergies  History   Social History  . Marital Status: Widowed    Spouse Name: N/A    Number of Children: N/A  . Years of Education: N/A   Occupational History  . Not on file.   Social History Main Topics  . Smoking status: Current Some Day Smoker  . Smokeless tobacco: Not on file  . Alcohol Use: Yes     Comment: occ  . Drug Use: No  . Sexual Activity: Not on file   Other Topics Concern  . Not on file   Social History Narrative  . No narrative on file    No family history on file.   Review of Systems:  See HPI for pertinent ROS. All other ROS negative.    Physical Exam: Pulse 76, temperature 97.5 F (36.4 C), temperature source Oral, resp. rate 18, height 5\' 11"  (1.803 m), weight 134 lb (60.782 kg), SpO2 98.00%., Body mass index is 18.7 kg/(m^2). staff had a difficult time hearing a blood pressure. Had a second nurse check his blood pressure as well. Initial staff got 92/60 on the right and 84/50 on the left. Second staff member got 84/46. General: Very thin, chacectic appearing male. Appears in no acute distress. Neck: Supple. No  thyromegaly. No lymphadenopathy.No JVD. Lungs: Clear bilaterally to auscultation without wheezes, rales, or rhonchi. Breathing is unlabored. Heart: Reg rhythm. IV/VI murmur, loudest at the apex. Abdomen: Soft, non-tender, non-distended with normoactive bowel sounds. No hepatomegaly. No rebound/guarding. No obvious abdominal masses. Musculoskeletal:  Strength and tone normal for age. Extremities/Skin: Warm and dry. No LE edema at all--very thin ankles- no swelling. Neuro: Alert and oriented X 3. Moves all extremities spontaneously. Gait is normal. CNII-XII grossly in tact. Psych:  Responds to questions appropriately with a normal affect.     ASSESSMENT AND PLAN:  80 y.o. year old male with  1. Acute diastolic congestive heart failure - CBC with Differential - BASIC METABOLIC PANEL WITH GFR - furosemide (LASIX) 20 MG tablet; Take 1 tablet (20 mg total) by mouth  daily.  Dispense: 30 tablet; Refill: 3  2. CAD  3. CAD (coronary artery disease)  4. Mitral valvular regurgitation  5. ANEMIA, NORMOCYTIC - CBC with Differential  6. Chronic anemia - CBC with Differential  7. SMOKER  8. Protein-calorie malnutrition, severe  Check labs to followup as above. Decrease his Lasix from 40 mg daily down to 20 mg daily. Not to even consider adding any ACE inhibitor or ARB secondary to his low blood pressure. Keep Appointment to followup with cardiology March 20 at 1:00 PM.   Signed, [address] [place], [place], [facility] 10/31/2013 1:53 PM

## 2013-11-01 LAB — BASIC METABOLIC PANEL WITH GFR
BUN: 21 mg/dL (ref 6–23)
CO2: 28 mEq/L (ref 19–32)
CREATININE: 1.17 mg/dL (ref 0.50–1.35)
Calcium: 9.2 mg/dL (ref 8.4–10.5)
Chloride: 100 mEq/L (ref 96–112)
GFR, EST NON AFRICAN AMERICAN: 53 mL/min — AB
GFR, Est African American: 62 mL/min
Glucose, Bld: 103 mg/dL — ABNORMAL HIGH (ref 70–99)
POTASSIUM: 3.8 meq/L (ref 3.5–5.3)
Sodium: 138 mEq/L (ref 135–145)

## 2013-11-01 LAB — CBC WITH DIFFERENTIAL/PLATELET
BASOS ABS: 0.1 10*3/uL (ref 0.0–0.1)
Basophils Relative: 1 % (ref 0–1)
Eosinophils Absolute: 0.1 10*3/uL (ref 0.0–0.7)
Eosinophils Relative: 1 % (ref 0–5)
HCT: 28.2 % — ABNORMAL LOW (ref 39.0–52.0)
Hemoglobin: 9.2 g/dL — ABNORMAL LOW (ref 13.0–17.0)
Lymphocytes Relative: 32 % (ref 12–46)
Lymphs Abs: 1.8 10*3/uL (ref 0.7–4.0)
MCH: 27.5 pg (ref 26.0–34.0)
MCHC: 32.6 g/dL (ref 30.0–36.0)
MCV: 84.4 fL (ref 78.0–100.0)
Monocytes Absolute: 0.4 10*3/uL (ref 0.1–1.0)
Monocytes Relative: 7 % (ref 3–12)
NEUTROS ABS: 3.2 10*3/uL (ref 1.7–7.7)
Neutrophils Relative %: 59 % (ref 43–77)
Platelets: 247 10*3/uL (ref 150–400)
RBC: 3.34 MIL/uL — ABNORMAL LOW (ref 4.22–5.81)
RDW: 14.1 % (ref 11.5–15.5)
WBC: 5.5 10*3/uL (ref 4.0–10.5)

## 2013-11-13 ENCOUNTER — Encounter: Payer: Self-pay | Admitting: Adult Health

## 2013-11-13 ENCOUNTER — Ambulatory Visit (INDEPENDENT_AMBULATORY_CARE_PROVIDER_SITE_OTHER): Payer: Medicare Other | Admitting: Adult Health

## 2013-11-13 VITALS — BP 146/69 | HR 73 | Ht 71.0 in | Wt 136.0 lb

## 2013-11-13 DIAGNOSIS — I509 Heart failure, unspecified: Secondary | ICD-10-CM

## 2013-11-13 DIAGNOSIS — I2510 Atherosclerotic heart disease of native coronary artery without angina pectoris: Secondary | ICD-10-CM

## 2013-11-13 DIAGNOSIS — F17200 Nicotine dependence, unspecified, uncomplicated: Secondary | ICD-10-CM

## 2013-11-13 DIAGNOSIS — I5031 Acute diastolic (congestive) heart failure: Secondary | ICD-10-CM

## 2013-11-13 DIAGNOSIS — I4891 Unspecified atrial fibrillation: Secondary | ICD-10-CM

## 2013-11-13 MED ORDER — IPRATROPIUM-ALBUTEROL 18-103 MCG/ACT IN AERO: 1.0000 | INHALATION_SPRAY | Freq: Four times a day (QID) | RESPIRATORY_TRACT | Status: DC | PRN

## 2013-11-13 NOTE — Assessment & Plan Note (Signed)
He is well compensated. He is still eating some salty foods, but not very much. He is advised to weigh daily and record. He denies PND or DOE, but he remains frail and moves slowly as a result. He will continue lasix as directed at 40 mg daily. He is to see Dr. Loleta ChanceHill this month at which time, labs will be drawn. Most recent labs were completed at the beginning of March 2015 Creatinine at that time 1.17 Wt is stable.

## 2013-11-13 NOTE — Assessment & Plan Note (Signed)
He unfortunately continues to smoke. He is advised on cessation. Doubt he will quit.

## 2013-11-13 NOTE — Assessment & Plan Note (Signed)
Remains in NSR. He continues on Plavix and ASA.

## 2013-11-13 NOTE — Assessment & Plan Note (Signed)
He is without complaints of chest pain. He is not overly active.He will continue on current medication regimen. Follow up in 6 months unless symptomatic.

## 2013-11-13 NOTE — Patient Instructions (Signed)
Your physician wants you to follow-up in: 4 months with Dr Koneswaran You will receive a reminder letter in the mail two months in advance. If you don't receive a letter, please call our office to schedule the follow-up appointment.   Your physician recommends that you continue on your current medications as directed. Please refer to the Current Medication list given to you today.      Thank you for choosing [facility] !        

## 2013-11-13 NOTE — Progress Notes (Deleted)
Name: Jeffrey [name]    DOB: August 21, 1918  Age: 80 y.o.  MR#: [id number]       PCP:  [name], PA-C      Insurance: Payor: [employer] MEDICARE / Plan: AARP MEDICARE COMPLETE / Product Type: *No Product type* /   CC:    Chief Complaint  Patient presents with  . Coronary Artery Disease  . PAD    VS Filed Vitals:   11/13/13 1500  BP: 146/69  Pulse: 73  Height: 5\' 11"  (1.803 m)  Weight: 136 lb (61.689 kg)  SpO2: 98%    Weights Current Weight  11/13/13 136 lb (61.689 kg)  10/31/13 134 lb (60.782 kg)  10/23/13 140 lb 10.5 oz (63.8 kg)    Blood Pressure  BP Readings from Last 3 Encounters:  11/13/13 146/69  10/23/13 103/63  08/27/13 126/60     Admit date:  (Not on file) Last encounter with RMR:  Visit date not found   Allergy Review of patient's allergies indicates no known allergies.  Current Outpatient Prescriptions  Medication Sig Dispense Refill  . albuterol-ipratropium (COMBIVENT) 18-103 MCG/ACT inhaler Inhale 1-2 puffs into the lungs every 6 (six) hours as needed for wheezing or shortness of breath.  1 Inhaler  0  . furosemide (LASIX) 40 MG tablet Take 40 mg by mouth daily.      . nitroGLYCERIN (NITROSTAT) 0.4 MG SL tablet Place 1 tablet (0.4 mg total) under the tongue every 5 (five) minutes as needed for chest pain (up to 3 doses).  25 tablet  3  . atorvastatin (LIPITOR) 40 MG tablet Take 1 tablet (40 mg total) by mouth every evening.  30 tablet  6  . clopidogrel (PLAVIX) 75 MG tablet Take 1 tablet (75 mg total) by mouth daily.  30 tablet  6  . metoprolol succinate (TOPROL-XL) 25 MG 24 hr tablet Take 1 tablet (25 mg total) by mouth daily.  30 tablet  6  . pantoprazole (PROTONIX) 40 MG tablet Take 1 tablet (40 mg total) by mouth daily.  30 tablet  2   No current facility-administered medications for this visit.    Discontinued Meds:    Medications Discontinued During This Encounter  Medication Reason  . furosemide (LASIX) 20 MG tablet Error     Patient Active Problem List   Diagnosis Date Noted  . Diastolic CHF, acute 10/21/2013  . COPD exacerbation 10/21/2013  . CAD (coronary artery disease)   . Hypertension   . Chronic anemia   . Paroxysmal a-fib   . COPD (chronic obstructive pulmonary disease)   . Hyperlipidemia   . Orthostatic hypotension 06/08/2013  . Deformity, orbit 06/08/2013  . Syncope 06/05/2013  . UTI (lower urinary tract infection) 06/05/2013  . Orthostasis 06/05/2013  . NSTEMI (non-ST elevated myocardial infarction) 06/05/2013  . Protein-calorie malnutrition, severe 06/05/2013  . Mitral valvular regurgitation 05/24/2013  . Pre-syncope 03/28/2013  . WEIGHT LOSS 04/10/2009  . TOE PAIN 01/03/2009  . PAROXYSMAL ATRIAL FIBRILLATION 10/03/2008  . CONSTIPATION 07/04/2008  . SMOKER 12/15/2007  . BLADDER NECK OBSTRUCTION 09/15/2007  . PVD 05/22/2007  . ANEMIA, NORMOCYTIC 01/05/2007  . HYPERLIPIDEMIA 01/03/2007  . ERECTILE DYSFUNCTION 01/03/2007  . TREMOR, ESSENTIAL 01/03/2007  . CATARACT NOS 01/03/2007  . CAD 01/03/2007  . DIASTOLIC DYSFUNCTION 01/03/2007  . CAROTID ARTERY STENOSIS 01/03/2007  . NEPHROLITHIASIS 01/03/2007  . OVERACTIVE BLADDER 01/03/2007  . EMBOLISM, OB BLOOD-CLOT , UNSPECIFIED EPISODE 01/03/2007  . BENIGN PROSTATIC HYPERTROPHY, HX OF 01/03/2007    LABS  Component Value Date/Time   NA 138 10/31/2013 1227   NA 141 10/23/2013 0615   NA 142 10/22/2013 0537   K 3.8 10/31/2013 1227   K 4.3 10/23/2013 0615   K 4.3 10/22/2013 0537   CL 100 10/31/2013 1227   CL 102 10/23/2013 0615   CL 104 10/22/2013 0537   CO2 28 10/31/2013 1227   CO2 30 10/23/2013 0615   CO2 29 10/22/2013 0537   GLUCOSE 103* 10/31/2013 1227   GLUCOSE 108* 10/23/2013 0615   GLUCOSE 161* 10/22/2013 0537   BUN 21 10/31/2013 1227   BUN 31* 10/23/2013 0615   BUN 27* 10/22/2013 0537   CREATININE 1.17 10/31/2013 1227   CREATININE 1.23 10/23/2013 0615   CREATININE 1.24 10/22/2013 0537   CREATININE 1.18 10/21/2013 1108   CREATININE 1.05  08/27/2013 1214   CREATININE 1.26 03/15/2013 1613   CALCIUM 9.2 10/31/2013 1227   CALCIUM 8.9 10/23/2013 0615   CALCIUM 8.6 10/22/2013 0537   GFRNONAA 49* 10/23/2013 0615   GFRNONAA 48* 10/22/2013 0537   GFRNONAA 51* 10/21/2013 1108   GFRAA 57* 10/23/2013 0615   GFRAA 56* 10/22/2013 0537   GFRAA 59* 10/21/2013 1108   CMP     Component Value Date/Time   NA 138 10/31/2013 1227   K 3.8 10/31/2013 1227   CL 100 10/31/2013 1227   CO2 28 10/31/2013 1227   GLUCOSE 103* 10/31/2013 1227   BUN 21 10/31/2013 1227   CREATININE 1.17 10/31/2013 1227   CREATININE 1.23 10/23/2013 0615   CALCIUM 9.2 10/31/2013 1227   PROT 7.1 08/27/2013 1214   ALBUMIN 3.8 08/27/2013 1214   AST 14 08/27/2013 1214   ALT <8 08/27/2013 1214   ALKPHOS 53 08/27/2013 1214   BILITOT 0.4 08/27/2013 1214   GFRNONAA 49* 10/23/2013 0615   GFRAA 57* 10/23/2013 0615       Component Value Date/Time   WBC 5.5 10/31/2013 1227   WBC 8.2 10/23/2013 1417   WBC 2.3* 10/22/2013 0537   HGB 9.2* 10/31/2013 1227   HGB 8.1* 10/23/2013 1417   HGB 7.7* 10/22/2013 0537   HCT 28.2* 10/31/2013 1227   HCT 25.2* 10/23/2013 1417   HCT 23.3* 10/22/2013 0537   MCV 84.4 10/31/2013 1227   MCV 89.0 10/23/2013 1417   MCV 88.3 10/22/2013 0537    Lipid Panel     Component Value Date/Time   CHOL 140 01/16/2009 2301   TRIG 87 01/16/2009 2301   HDL 44 01/16/2009 2301   CHOLHDL 3.2 Ratio 01/16/2009 2301   VLDL 17 01/16/2009 2301   LDLCALC 79 01/16/2009 2301    ABG    Component Value Date/Time   PHART 7.422 12/28/2007 0840   PCO2ART 39.1 12/28/2007 0840   PO2ART 92.1 12/28/2007 0840   HCO3 25.0* 12/28/2007 0840   TCO2 22.5 12/28/2007 0840   O2SAT 97.0 12/28/2007 0840     Lab Results  Component Value Date   TSH 0.787 06/05/2013   BNP (last 3 results)  Recent Labs  10/21/13 1144  PROBNP 4163.0*   Cardiac Panel (last 3 results) No results found for this basename: CKTOTAL, CKMB, TROPONINI, RELINDX,  in the last 72 hours  Iron/TIBC/Ferritin    Component Value Date/Time    IRON 64 05/24/2013 1249   TIBC 297 05/24/2013 1249   FERRITIN 182 05/24/2013 1249     EKG Orders placed during the hospital encounter of 10/21/13  . ED EKG  . ED EKG  . EKG     Prior Assessment  and Plan Problem List as of 11/13/2013     Cardiovascular and Mediastinum   CAD   PAROXYSMAL ATRIAL FIBRILLATION   DIASTOLIC DYSFUNCTION   CAROTID ARTERY STENOSIS   PVD   Mitral valvular regurgitation   Syncope   Orthostatic hypotension   Pre-syncope   Orthostasis   NSTEMI (non-ST elevated myocardial infarction)   Diastolic CHF, acute   CAD (coronary artery disease)   Hypertension   Paroxysmal a-fib     Respiratory   COPD (chronic obstructive pulmonary disease)   COPD exacerbation     Digestive   CONSTIPATION     Genitourinary   NEPHROLITHIASIS   BLADDER NECK OBSTRUCTION   OVERACTIVE BLADDER   UTI (lower urinary tract infection)     Other   HYPERLIPIDEMIA   ANEMIA, NORMOCYTIC   ERECTILE DYSFUNCTION   SMOKER   TREMOR, ESSENTIAL   CATARACT NOS   EMBOLISM, OB BLOOD-CLOT , UNSPECIFIED EPISODE   TOE PAIN   WEIGHT LOSS   BENIGN PROSTATIC HYPERTROPHY, HX OF   Protein-calorie malnutrition, severe   Deformity, orbit   Chronic anemia   Hyperlipidemia       Imaging: X-ray Chest Pa And Lateral   10/22/2013   CLINICAL DATA:  CHF follow up.  EXAM: CHEST  2 VIEW  COMPARISON:  DG CHEST 1V PORT dated 10/21/2013  FINDINGS: There is prominence of the interstitial markings and mild peribronchial cuffing. When compared to previous study these findings have decreased in conspicuity. Cardiac silhouette is enlarged. Atherosclerotic calcifications identified within the aorta. Blunting of the costophrenic angles appreciated as well as mild increased density within the lung bases. No focal regions consolidation. The osseous structures are unremarkable.  IMPRESSION: Improved pulmonary edema, with mild residua. No new focal regions of consolidation or new focal infiltrates.   Electronically  Signed   By: Salome Holmes M.D.   On: 10/22/2013 09:09   Dg Chest Portable 1 View  10/21/2013   CLINICAL DATA:  Shortness of breath.  Cough.  Chest congestion.  EXAM: PORTABLE CHEST - 1 VIEW  COMPARISON:  06/04/2013  FINDINGS: Cardiomegaly again demonstrated. Diffuse bilateral interstitial infiltrates are seen, consistent diffuse pulmonary edema. Probable small bilateral pleural effusions and bibasilar atelectasis also noted.  IMPRESSION: Diffuse pulmonary edema with probable small pleural effusions and bibasilar atelectasis.   Electronically Signed   By: Myles Rosenthal M.D.   On: 10/21/2013 11:40

## 2013-11-13 NOTE — Progress Notes (Signed)
HPI: [name] comes is a 80 year old patient of Dr. Beulah Gandy we are following for ongoing assessment and management of severe multivessel CAD. The patient has had cardiac catheterization in December of 2014 demonstrating severe LMCA (70-80% distal) and three-vessel disease, being both unsuitable for PCI and given his advanced age CABG was not an option. Therefore he is being treated medically.  The patient is normally followed in the evening office, and was seen by Dr. Beulah Gandy in December of 2014. At that time he was stable from a cardiac standpoint. Metoprolol prescription was given as he had run out. Most recent echo did reveal mitral regurg with moderate to mild anterior leaflet prolapse. He was continued medically managed without use of diuretics at the time of last office visit. He also has history of CAD, and was continued on Plavix.  Unfortunately, the patient was admitted to [facility] in the setting of diastolic CHF, and paroxysmal atrial fibrillation. He also had COPD exacerbation and evidence of anemia. He was briefly treated with IV Lasix with improvement. He was transitioned to by mouth Lasix. He was continued on all other medications with exception of ACE and ARB, due to chronic kidney disease stage II. He was also treated for COPD exacerbation with antibiotics and IV steroids. He was counseled on tobacco cessation.  He comes today with his wife who is his main caregiver. He is feeling well. Denies fluid retention, DOE, palpitations, or chest pain. He is medically compliant. He occasionally eats salted meats. Overall he is feeling better and is without complaints.  No Known Allergies  Current Outpatient Prescriptions  Medication Sig Dispense Refill  . albuterol-ipratropium (COMBIVENT) 18-103 MCG/ACT inhaler Inhale 1-2 puffs into the lungs every 6 (six) hours as needed for wheezing or shortness of breath.  1 Inhaler  0  . furosemide (LASIX) 40 MG tablet Take 40 mg by  mouth daily.      . nitroGLYCERIN (NITROSTAT) 0.4 MG SL tablet Place 1 tablet (0.4 mg total) under the tongue every 5 (five) minutes as needed for chest pain (up to 3 doses).  25 tablet  3  . atorvastatin (LIPITOR) 40 MG tablet Take 1 tablet (40 mg total) by mouth every evening.  30 tablet  6  . clopidogrel (PLAVIX) 75 MG tablet Take 1 tablet (75 mg total) by mouth daily.  30 tablet  6  . metoprolol succinate (TOPROL-XL) 25 MG 24 hr tablet Take 1 tablet (25 mg total) by mouth daily.  30 tablet  6  . pantoprazole (PROTONIX) 40 MG tablet Take 1 tablet (40 mg total) by mouth daily.  30 tablet  2   No current facility-administered medications for this visit.    Past Medical History  Diagnosis Date  . CAD (coronary artery disease)     a. NSTEMI 2010 felt due to AF-RVR in setting of multivessel CAD, not surgical candidate. b. NSTEMI trop >20, 05/2013 - 3v CAD incl LM - for med rx.  . MI, old   . Mitral valvular regurgitation     a. Mod by echo 10/20114.  . Atrial fibrillation   . Hypertension   . Syncope     a. 05/2013 admission: felt due to NSTEMI vs orthostasis.  Marland Kitchen NSVT (nonsustained ventricular tachycardia)     a. 05/2013 admission on tele. No sustained arrhythmias.  . Severe protein-calorie malnutrition   . Chronic anemia     a. s/p transfusion 05/2013.  . Orthostatic hypotension     a. During  05/2013 admission (BP 147->84 lying to sitting w/ associated dizziness).  . Paroxysmal a-fib   . COPD (chronic obstructive pulmonary disease)   . Hyperlipidemia   . PAD (peripheral artery disease)     Past Surgical History  Procedure Laterality Date  . Coronary angioplasty with stent placement      ZOX:WRUEAVROS:Review of systems complete and found to be negative unless listed above  PHYSICAL EXAM BP 146/69  Pulse 73  Ht 5\' 11"  (1.803 m)  Wt 136 lb (61.689 kg)  BMI 18.98 kg/m2  SpO2 98%  General: Well developed, well nourished, in no acute distress Head: Eyes PERRLA, No xanthomas.   Normal  cephalic and atramatic  Lungs: Clear bilaterally to auscultation and percussion. Heart: HRRR S1 S2, 1/6 systolic murmur.  Pulses are 2+ & equal.           Bilateral carotid bruits. No JVD.  No abdominal bruits. No femoral bruits. Abdomen: Bowel sounds are positive, abdomen soft and non-tender without masses or                  Hernia's noted. Msk:  Back normal, normal gait. Normal strength and tone for age. Extremities: No clubbing, cyanosis or edema.  DP +1 Neuro: Alert and oriented X 3. Psych:  Good affect, responds appropriately   ASSESSMENT AND PLAN

## 2013-11-16 ENCOUNTER — Telehealth: Payer: Self-pay | Admitting: *Deleted

## 2013-11-16 ENCOUNTER — Ambulatory Visit: Payer: Medicare Other | Admitting: Cardiovascular Disease

## 2013-11-16 MED ORDER — IPRATROPIUM-ALBUTEROL 18-103 MCG/ACT IN AERO: 1.0000 | INHALATION_SPRAY | Freq: Four times a day (QID) | RESPIRATORY_TRACT | Status: DC | PRN

## 2013-11-16 NOTE — Telephone Encounter (Signed)
PT NEEDS INHAILER CALLED IN TO [email or web address]. THEY WENT TO PICK IT UP AND WERE TOLD THAT THEY DID NOT GET A REFILL REQUEST FOR IT.

## 2013-11-16 NOTE — Telephone Encounter (Signed)
Medication sent via escribe again

## 2013-11-16 NOTE — Telephone Encounter (Signed)
PT STATES THAT [facility] DID NOT HAVE REFILLS FOR RX CALLED IN 11/13/13 DURING HIS APPOINTMENT

## 2014-01-23 ENCOUNTER — Ambulatory Visit (INDEPENDENT_AMBULATORY_CARE_PROVIDER_SITE_OTHER): Payer: Medicare Other | Admitting: Physician Assistant

## 2014-01-23 ENCOUNTER — Encounter: Payer: Self-pay | Admitting: Physician Assistant

## 2014-01-23 VITALS — BP 104/66 | HR 72 | Temp 97.1°F | Resp 18 | Wt 132.0 lb

## 2014-01-23 DIAGNOSIS — I951 Orthostatic hypotension: Secondary | ICD-10-CM

## 2014-01-23 DIAGNOSIS — I519 Heart disease, unspecified: Secondary | ICD-10-CM

## 2014-01-23 DIAGNOSIS — R55 Syncope and collapse: Secondary | ICD-10-CM

## 2014-01-23 DIAGNOSIS — R634 Abnormal weight loss: Secondary | ICD-10-CM

## 2014-01-23 DIAGNOSIS — I2510 Atherosclerotic heart disease of native coronary artery without angina pectoris: Secondary | ICD-10-CM

## 2014-01-23 DIAGNOSIS — I480 Paroxysmal atrial fibrillation: Secondary | ICD-10-CM

## 2014-01-23 DIAGNOSIS — I340 Nonrheumatic mitral (valve) insufficiency: Secondary | ICD-10-CM

## 2014-01-23 DIAGNOSIS — I4891 Unspecified atrial fibrillation: Secondary | ICD-10-CM

## 2014-01-23 DIAGNOSIS — I6529 Occlusion and stenosis of unspecified carotid artery: Secondary | ICD-10-CM

## 2014-01-23 DIAGNOSIS — E785 Hyperlipidemia, unspecified: Secondary | ICD-10-CM

## 2014-01-23 DIAGNOSIS — I214 Non-ST elevation (NSTEMI) myocardial infarction: Secondary | ICD-10-CM

## 2014-01-23 DIAGNOSIS — I059 Rheumatic mitral valve disease, unspecified: Secondary | ICD-10-CM

## 2014-01-23 DIAGNOSIS — E43 Unspecified severe protein-calorie malnutrition: Secondary | ICD-10-CM

## 2014-01-23 NOTE — Progress Notes (Signed)
Patient ID: Jeffrey [name] MRN: [id number], DOB: 15-Jul-1918, 80 y.o. Date of Encounter: @DATE @  Chief Complaint:  Chief Complaint  Patient presents with  . c/o weakness and falling    dizziness    HPI: 65 y.o. year old male  presents with girlfriend. He reports that they are here because of recent episodes of feeling weak and lightheaded. I discussed with them that I have seen [name] for the same complaints in the past. Also discussed with them they have also been in the hospital secondary to the same complaints and he has also been seen by cardiology because of the same complaints. I reminded them that he has known problems with his heart including multivessel CAD. However, discussed that this is being " medically treated ".   He and the girlfriend both make mention of past episodes. They mention him having some spells while going to [employer] and other stores. Asked him what prompted him to schedule today's visit specifically. He says that just in the last couple days he had been sitting in the living room and when he got up to walk to the other room he felt lightheaded.  He has had no recent episodes of frank syncope or fall.   Past Medical History  Diagnosis Date  . CAD (coronary artery disease)     a. NSTEMI 2010 felt due to AF-RVR in setting of multivessel CAD, not surgical candidate. b. NSTEMI trop >20, 05/2013 - 3v CAD incl LM - for med rx.  . MI, old   . Mitral valvular regurgitation     a. Mod by echo 10/20114.  . Atrial fibrillation   . Hypertension   . Syncope     a. 05/2013 admission: felt due to NSTEMI vs orthostasis.  Marland Kitchen NSVT (nonsustained ventricular tachycardia)     a. 05/2013 admission on tele. No sustained arrhythmias.  . Severe protein-calorie malnutrition   . Chronic anemia     a. s/p transfusion 05/2013.  . Orthostatic hypotension     a. During 05/2013 admission (BP 147->84 lying to sitting w/ associated dizziness).  . Paroxysmal a-fib   . COPD  (chronic obstructive pulmonary disease)   . Hyperlipidemia   . PAD (peripheral artery disease)      Home Meds: Outpatient Prescriptions Prior to Visit  Medication Sig Dispense Refill  . albuterol-ipratropium (COMBIVENT) 18-103 MCG/ACT inhaler Inhale 1-2 puffs into the lungs every 6 (six) hours as needed for wheezing or shortness of breath.  1 Inhaler  0  . atorvastatin (LIPITOR) 40 MG tablet Take 1 tablet (40 mg total) by mouth every evening.  30 tablet  6  . nitroGLYCERIN (NITROSTAT) 0.4 MG SL tablet Place 1 tablet (0.4 mg total) under the tongue every 5 (five) minutes as needed for chest pain (up to 3 doses).  25 tablet  3  . clopidogrel (PLAVIX) 75 MG tablet Take 1 tablet (75 mg total) by mouth daily.  30 tablet  6  . furosemide (LASIX) 40 MG tablet Take 40 mg by mouth daily.      . metoprolol succinate (TOPROL-XL) 25 MG 24 hr tablet Take 1 tablet (25 mg total) by mouth daily.  30 tablet  6  . pantoprazole (PROTONIX) 40 MG tablet Take 1 tablet (40 mg total) by mouth daily.  30 tablet  2   No facility-administered medications prior to visit.    Allergies: No Known Allergies  History   Social History  . Marital Status: Widowed  Spouse Name: N/A    Number of Children: N/A  . Years of Education: N/A   Occupational History  . Not on file.   Social History Main Topics  . Smoking status: Current Some Day Smoker -- 0.50 packs/day for 60 years    Types: Cigarettes  . Smokeless tobacco: Not on file  . Alcohol Use: Yes     Comment: occ  . Drug Use: No  . Sexual Activity: Not on file   Other Topics Concern  . Not on file   Social History Narrative  . No narrative on file    No family history on file.   Review of Systems:  See HPI for pertinent ROS. All other ROS negative.    Physical Exam: Blood pressure 104/66, pulse 72, temperature 97.1 F (36.2 C), temperature source Oral, resp. rate 18, weight 132 lb (59.875 kg), SpO2 98.00%., Body mass index is 18.42  kg/(m^2). General: Extremey thin, chacectic male. Appears in no acute distress. Neck: Supple. No thyromegaly. No lymphadenopathy. Lungs: Clear bilaterally to auscultation without wheezes, rales, or rhonchi. Breathing is unlabored. Heart: Reg Rhythm. III / VI murmur at apex Musculoskeletal:  Strength and tone normal for age. Extremities/Skin: Warm and dry. Neuro: Alert and oriented X 3. Moves all extremities spontaneously. Gait is normal. CNII-XII grossly in tact. Psych:  Responds to questions appropriately with a normal affect.     ASSESSMENT AND PLAN:  80 y.o. year old male with  1. PAROXYSMAL ATRIAL FIBRILLATION  2. DIASTOLIC DYSFUNCTION  3. CAROTID ARTERY STENOSIS  4. Mitral valvular regurgitation  5. Syncope  6. Orthostatic hypotension  7. WEIGHT LOSS  8. Pre-syncope  9. Orthostasis  10. NSTEMI (non-ST elevated myocardial infarction)  11. Protein-calorie malnutrition, severe  12. CAD (coronary artery disease)  13. Paroxysmal a-fib  14. Other and unspecified hyperlipidemia - Hepatic function panel  I explained again to the patient and the girlfriend that he has known problems with his heart but there is nothing to do about them. I explained that his current symptoms are secondary to these cardiac problems. Also probably also secondary to his poor nutrition and weight. He says that he likes Ensure but has run out. Told him to buy more Ensure and to drink at least one per day. Discussed that he can even drink one 2 or 3 times per day.  Reminded him to make sure he stands up very slowly and stand up for a few minutes prior to walking across the room. Tried to explain to the patient and the girlfriend that he may even need to stay in the house and stop going to the store to reduce risk of falls/syncope.  He states that the medicines that he brought with him today are the only medications that he is currently on. This includes inhaled medication andt he only pill form  (oral) medicine he is on is his cholesterol pill.  He is not on the Lasix or the Toprol. Nurse comments next to these medicines are not showing up in the medication list in my note. Neither of these have been refilled recently. He is on no medicines to affect his blood pressure. Reviewed that the last time liver functions were checked was 08/27/13. Will recheck LFTs now.   he had a CBC and a BMET 10/31/13.    Murray HodgkinsSigned, Mary Beth PurdyDixon, GeorgiaPA, North Shore Endoscopy CenterBSFM 01/23/2014 2:23 PM

## 2014-01-24 LAB — HEPATIC FUNCTION PANEL
ALT: <8 U/L (ref 0–53)
AST: 14 U/L (ref 0–37)
Albumin: 3.9 g/dL (ref 3.5–5.2)
Alkaline Phosphatase: 54 U/L (ref 39–117)
BILIRUBIN TOTAL: 0.3 mg/dL (ref 0.2–1.2)
Bilirubin, Direct: 0.1 mg/dL (ref 0.0–0.3)
Indirect Bilirubin: 0.2 mg/dL (ref 0.2–1.2)
Total Protein: 6.6 g/dL (ref 6.0–8.3)

## 2014-02-04 ENCOUNTER — Encounter: Payer: Self-pay | Admitting: Physician Assistant

## 2014-02-04 ENCOUNTER — Ambulatory Visit (INDEPENDENT_AMBULATORY_CARE_PROVIDER_SITE_OTHER): Payer: Medicare Other | Admitting: Physician Assistant

## 2014-02-04 ENCOUNTER — Other Ambulatory Visit: Payer: Self-pay | Admitting: Family Medicine

## 2014-02-04 VITALS — BP 170/104 | HR 84 | Temp 98.2°F | Resp 18 | Wt 127.0 lb

## 2014-02-04 DIAGNOSIS — E785 Hyperlipidemia, unspecified: Secondary | ICD-10-CM

## 2014-02-04 DIAGNOSIS — I340 Nonrheumatic mitral (valve) insufficiency: Secondary | ICD-10-CM

## 2014-02-04 DIAGNOSIS — I6529 Occlusion and stenosis of unspecified carotid artery: Secondary | ICD-10-CM

## 2014-02-04 DIAGNOSIS — R55 Syncope and collapse: Secondary | ICD-10-CM

## 2014-02-04 DIAGNOSIS — I519 Heart disease, unspecified: Secondary | ICD-10-CM

## 2014-02-04 DIAGNOSIS — I059 Rheumatic mitral valve disease, unspecified: Secondary | ICD-10-CM

## 2014-02-04 DIAGNOSIS — I4891 Unspecified atrial fibrillation: Secondary | ICD-10-CM

## 2014-02-04 DIAGNOSIS — I951 Orthostatic hypotension: Secondary | ICD-10-CM

## 2014-02-04 NOTE — Progress Notes (Signed)
Patient ID: Jeffrey [name] MRN: [id number], DOB: Sep 05, 1917, 80 y.o. Date of Encounter: @DATE @  Chief Complaint:  Chief Complaint  Patient presents with  . increased weakness over past sev days    states has run out if his blood thinner??    HPI: 80 y.o. year old male  presents with girlfriend. He reports that they are here because of recent episodes of feeling weak and lightheaded.   He just had OV 01/23/14 with me --had that same complaint at that OV.  At that OV I discussed with them that I have seen [name] for the same complaints in the past. Also discussed with them they have also been in the hospital secondary to the same complaints and he has also been seen by cardiology because of the same complaints. I reminded them that he has known problems with his heart including multivessel CAD. However, discussed that this is being " medically treated ".   At LOV 01/23/14 he and the girlfriend both make mention of past episodes. They mention him having some spells while going to [employer] and other stores. Asked him what prompted him to schedule that day's visit specifically. He said that just in the last couple days he had been sitting in the living room and when he got up to walk to the other room he felt lightheaded.  He has had no recent episodes of frank syncope or fall.  At OV 01/23/14: I explained again to the patient and the girlfriend that he has known problems with his heart but there is nothing to do about them. I explained that his current symptoms are secondary to these cardiac problems. Also probably also secondary to his poor nutrition and weight. He says that he likes Ensure but has run out. Told him to buy more Ensure and to drink at least one per day. Discussed that he can even drink one 2 or 3 times per day.  Reminded him to make sure he stands up very slowly and stand up for a few minutes prior to walking across the room. Tried to explain to the patient and the  girlfriend that he may even need to stay in the house and stop going to the store to reduce risk of falls/syncope.  He states that the medicines that he brought with him today are the only medications that he is currently on. This includes inhaled medication andt he only pill form (oral) medicine he is on is his cholesterol pill.  He is not on the Lasix or the Toprol. Nurse comments next to these medicines are not showing up in the medication list in my note. Neither of these have been refilled recently. He is on no medicines to affect his blood pressure. Reviewed that the last time liver functions were checked was 08/27/13. Will recheck LFTs now.   he had a CBC and a BMET 10/31/13, which were stable.   TODAY: Today I asked patient if he has started using the Ensure daily as we had discussed at the last visit. His response is, " Would love to take it---when I have the money---" Also I asked the girlfriend and him whether they had looked into any options regarding home health nurse or any type of facility to assist him. Her response is " I'm the one doing that"  However today when patient came in from the lobby down our hall and was getting ready to stand on the scales to weigh he was about to fall--nurse  had to call for help--wheelchair used.   Past Medical History  Diagnosis Date  . CAD (coronary artery disease)     a. NSTEMI 2010 felt due to AF-RVR in setting of multivessel CAD, not surgical candidate. b. NSTEMI trop >20, 05/2013 - 3v CAD incl LM - for med rx.  . MI, old   . Mitral valvular regurgitation     a. Mod by echo 10/20114.  . Atrial fibrillation   . Hypertension   . Syncope     a. 05/2013 admission: felt due to NSTEMI vs orthostasis.  Marland Kitchen. NSVT (nonsustained ventricular tachycardia)     a. 05/2013 admission on tele. No sustained arrhythmias.  . Severe protein-calorie malnutrition   . Chronic anemia     a. s/p transfusion 05/2013.  . Orthostatic hypotension     a. During  05/2013 admission (BP 147->84 lying to sitting w/ associated dizziness).  . Paroxysmal a-fib   . COPD (chronic obstructive pulmonary disease)   . Hyperlipidemia   . PAD (peripheral artery disease)      Home Meds: Outpatient Prescriptions Prior to Visit  Medication Sig Dispense Refill  . atorvastatin (LIPITOR) 40 MG tablet Take 1 tablet (40 mg total) by mouth every evening.  30 tablet  6  . albuterol-ipratropium (COMBIVENT) 18-103 MCG/ACT inhaler Inhale 1-2 puffs into the lungs every 6 (six) hours as needed for wheezing or shortness of breath.  1 Inhaler  0  . clopidogrel (PLAVIX) 75 MG tablet Take 1 tablet (75 mg total) by mouth daily.  30 tablet  6  . furosemide (LASIX) 40 MG tablet Take 40 mg by mouth daily.      . metoprolol succinate (TOPROL-XL) 25 MG 24 hr tablet Take 1 tablet (25 mg total) by mouth daily.  30 tablet  6  . nitroGLYCERIN (NITROSTAT) 0.4 MG SL tablet Place 1 tablet (0.4 mg total) under the tongue every 5 (five) minutes as needed for chest pain (up to 3 doses).  25 tablet  3  . pantoprazole (PROTONIX) 40 MG tablet Take 1 tablet (40 mg total) by mouth daily.  30 tablet  2   No facility-administered medications prior to visit.    Allergies: No Known Allergies  History   Social History  . Marital Status: Widowed    Spouse Name: N/A    Number of Children: N/A  . Years of Education: N/A   Occupational History  . Not on file.   Social History Main Topics  . Smoking status: Current Some Day Smoker -- 0.50 packs/day for 60 years    Types: Cigarettes  . Smokeless tobacco: Not on file  . Alcohol Use: Yes     Comment: occ  . Drug Use: No  . Sexual Activity: Not on file   Other Topics Concern  . Not on file   Social History Narrative  . No narrative on file    No family history on file.   Review of Systems:  See HPI for pertinent ROS. All other ROS negative.    Physical Exam: Blood pressure 170/104, pulse 84, temperature 98.2 F (36.8 C), temperature  source Oral, resp. rate 18, weight 127 lb (57.607 kg), SpO2 97.00%., Body mass index is 17.72 kg/(m^2). General: Extremey thin, chacectic male. Appears in no acute distress. Neck: Supple. No thyromegaly. No lymphadenopathy. Lungs: Clear bilaterally to auscultation without wheezes, rales, or rhonchi. Breathing is unlabored. Heart: Reg Rhythm. III / VI murmur at apex Musculoskeletal:  Strength and tone normal for age. Extremities/Skin: Warm  and dry. Neuro: Alert and oriented X 3. Moves all extremities spontaneously. Gait is normal. CNII-XII grossly in tact. Psych:  Responds to questions appropriately with a normal affect.     ASSESSMENT AND PLAN:  80 y.o. year old male with  1. PAROXYSMAL ATRIAL FIBRILLATION  2. DIASTOLIC DYSFUNCTION  3. CAROTID ARTERY STENOSIS  4. Mitral valvular regurgitation  5. Syncope  6. Orthostatic hypotension  7. WEIGHT LOSS  8. Pre-syncope  9. Orthostasis  10. NSTEMI (non-ST elevated myocardial infarction)  11. Protein-calorie malnutrition, severe  12. CAD (coronary artery disease)  13. Paroxysmal a-fib  14. Other and unspecified hyperlipidemia   I explained again to the patient and the girlfriend that he has known problems with his heart but there is nothing to do about them. I explained that his current symptoms are secondary to these cardiac problems. Also probably also secondary to his poor nutrition and weight. He says that he likes Ensure but has run out. Told him to buy more Ensure and to drink at least one per day. Discussed that he can even drink one 2 or 3 times per day.  Today--it seems that he cannot afford to buy Ensure.  Reminded him to make sure he stands up very slowly and stand up for a few minutes prior to walking across the room. Tried to explain to the patient and the girlfriend that he may even need to stay in the house and stop going to the store to reduce risk of falls/syncope.  At LOV 01/23/14:  He stated that the  medicines that he brought with him that day were the only medications that he was currently on. This includes inhaled medication andt he only pill form (oral) medicine he is on is his cholesterol pill.  He was not on the Lasix or the Toprol. Nurse comments next to these medicines are not showing up in the medication list in my note. Neither of these have been refilled recently.  TODAY: Discuss that his blood pressure is elevated. He needs to get back on his Toprol. All friend voices understanding and states that she will get medicine at pharmacy and make sure he is taking daily. In addition he needs to get back on his Plavix daily.  Selena Batten is going to contact [facility] / home health agency. Will have a home health nurse go into his home to evaluate. Hopefully they can at least help make sure he is taking his medicines are as directed possibly get him some physical therapy to help with strengthening. Also will need to evaluate finances as far as Ensure and appropriate diet. Also will need to make sure that he has care available to assist him with ADLs. Hospice may even be appropriate option.    he had a CBC and a BMET 10/31/13. These were stable at that time. Will not repeat labs now.  He did have significant anemia and recent hospitalization but treatment consisted of an effusion with no further intervention.    Jeffrey [name] [place], [place], [facility] 02/04/2014 4:08 PM

## 2014-02-14 ENCOUNTER — Other Ambulatory Visit: Payer: Self-pay | Admitting: Family Medicine

## 2014-02-14 ENCOUNTER — Telehealth: Payer: Self-pay | Admitting: Family Medicine

## 2014-02-14 MED ORDER — METOPROLOL SUCCINATE ER 25 MG PO TB24: 25.0000 mg | ORAL_TABLET | Freq: Every day | ORAL | Status: DC

## 2014-02-14 MED ORDER — NITROGLYCERIN 0.4 MG SL SUBL: 0.4000 mg | SUBLINGUAL_TABLET | SUBLINGUAL | Status: DC | PRN

## 2014-02-14 MED ORDER — CLOPIDOGREL BISULFATE 75 MG PO TABS: 75.0000 mg | ORAL_TABLET | Freq: Every day | ORAL | Status: DC

## 2014-02-14 NOTE — Telephone Encounter (Signed)
Ok.  otherwise, maybe [facility] can help with getting him some Ensure--I had told him to drink Ensure at least once per day at prior visits but he was not doing so at the last visit.

## 2014-02-14 NOTE — Telephone Encounter (Signed)
Pt is not on any BP meds. Jeffrey HesselbachMaria states that he takes a bottle of medication and throws it away and never gets it refilled. He is suspose to call his cardiologist to see if they still want him to continue this and if so he will need a new RX.  I sent in new rx's for some of his meds this AM but the only thing he is currently taking is Lipitor, omeprazole and eye drops.

## 2014-02-14 NOTE — Telephone Encounter (Signed)
Cardiology has him on Lasix 40 mg. Tell them to decrease this to 20mg . F/U if he starts to develop swelling or shortness of breath.  Send new prescription for Lasix 20 mg 1 by mouth daily.

## 2014-02-14 NOTE — Telephone Encounter (Signed)
Home health is admitting pt and was checking his BP and sitting it is 104/52 and when he stands his bp drops to 80/40. Please advise

## 2014-02-14 NOTE — Telephone Encounter (Signed)
Jeffrey [name] aware and will try to get him to increase his fluid intake also.

## 2014-02-21 ENCOUNTER — Telehealth: Payer: Self-pay | Admitting: *Deleted

## 2014-02-21 DIAGNOSIS — I519 Heart disease, unspecified: Secondary | ICD-10-CM

## 2014-02-21 DIAGNOSIS — I2510 Atherosclerotic heart disease of native coronary artery without angina pectoris: Secondary | ICD-10-CM

## 2014-02-21 DIAGNOSIS — I482 Chronic atrial fibrillation, unspecified: Secondary | ICD-10-CM

## 2014-02-21 DIAGNOSIS — J449 Chronic obstructive pulmonary disease, unspecified: Secondary | ICD-10-CM

## 2014-02-21 DIAGNOSIS — I5031 Acute diastolic (congestive) heart failure: Secondary | ICD-10-CM

## 2014-02-21 DIAGNOSIS — R627 Adult failure to thrive: Secondary | ICD-10-CM

## 2014-02-21 NOTE — Telephone Encounter (Signed)
[place] [facility]   Reports that pt BP today sitting is 94/68, but when patient stands, his BP drops to 60/40.  States that he is not taking any BP meds or Lasix. Reports that he has increased fluid intake of water, juice and Ensure.   Patient does voice c/o dizziness, but no further S/Sx of distress noted.   PA to be made aware.

## 2014-02-21 NOTE — Telephone Encounter (Signed)
[facility] and [facility] if [facility] thinks [facility] would be beneficial. Dx: Failure to thrive, Severe CAD, etc

## 2014-02-21 NOTE — Telephone Encounter (Signed)
Called [facility] main office.  Was able to get phone number for nurse seeing this patient.  Called nurse.  She did not sound very knowledgeable about the patient.  Did not have his chart in front of her.  Remembers her concern for his BP and him being very Orthostatic when up.  Discussed Hospice care.  She wanted to know if we were going to talk to patient about this.  Again did not sound very knowledgeable about patient.  Will try to call patient and discuss this with him.  They can offer him more services at this time.  Is failure to thrive and is increasingly more difficult for him to leave his home.

## 2014-02-26 NOTE — Telephone Encounter (Signed)
Thanks! Perfect!

## 2014-02-26 NOTE — Telephone Encounter (Signed)
Called and spoke to wife and caregiver.  Discussed benefit of calling Hospice in to help care for Jeffrey [name].  In his current condition, they will be able to offer additional care he is not receiving now.  Due to the very tasking effort that must be made to get him out of the house, the Hospice nurses can coordinate his care form home.  Also provide additional person care assist.  Patient and caregiver are agreeable.  Hospice referral initiated.

## 2014-02-26 NOTE — Addendum Note (Signed)
Addended by: Donne AnonPLUMMER, Brooks Stotz M on: 02/26/2014 09:21 AM   Modules accepted: Orders

## 2014-03-07 ENCOUNTER — Telehealth: Payer: Self-pay | Admitting: *Deleted

## 2014-03-07 NOTE — Telephone Encounter (Signed)
Call returned to [place].   Reports that patient was picked up for hospice services on 03/06/2014.  Requested to have hospital records from 05/2013 faxed to their office.   Faxed records.

## 2014-03-07 NOTE — Telephone Encounter (Signed)
Hospice admitting Dx: protein calorie malnutrition.

## 2014-03-07 NOTE — Telephone Encounter (Signed)
Message copied by Phillips OdorSIX, CHRISTINA H on Thu Mar 07, 2014  1:07 PM ------      Message from: Harriet MassonOBERTS, CHELSEA N      Created: Thu Mar 07, 2014  9:12 AM      Regarding: Hospice      Contact: [phone]       Devona KonigChristina Melvin from hospice has called and left a message stating that she was calling in regards to this pt ------

## 2014-03-26 ENCOUNTER — Other Ambulatory Visit: Payer: Self-pay | Admitting: Family Medicine

## 2014-03-26 DIAGNOSIS — E785 Hyperlipidemia, unspecified: Secondary | ICD-10-CM

## 2014-03-26 DIAGNOSIS — Z87438 Personal history of other diseases of male genital organs: Secondary | ICD-10-CM

## 2014-03-26 DIAGNOSIS — D649 Anemia, unspecified: Secondary | ICD-10-CM

## 2014-03-26 DIAGNOSIS — F17200 Nicotine dependence, unspecified, uncomplicated: Secondary | ICD-10-CM

## 2014-03-26 DIAGNOSIS — E43 Unspecified severe protein-calorie malnutrition: Secondary | ICD-10-CM

## 2014-03-26 DIAGNOSIS — I10 Essential (primary) hypertension: Secondary | ICD-10-CM

## 2014-04-12 ENCOUNTER — Other Ambulatory Visit: Payer: Medicare Other

## 2014-04-17 ENCOUNTER — Encounter: Payer: Medicare Other | Admitting: Physician Assistant

## 2014-06-15 ENCOUNTER — Inpatient Hospital Stay (HOSPITAL_COMMUNITY)
Admission: EM | Admit: 2014-06-15 | Discharge: 2014-06-21 | DRG: 812 | Disposition: A | Discharge status: Skilled Nursing Facility | Payer: Medicare Other | Attending: Internal Medicine | Admitting: Internal Medicine

## 2014-06-15 ENCOUNTER — Encounter (HOSPITAL_COMMUNITY): Payer: Self-pay | Admitting: Emergency Medicine

## 2014-06-15 ENCOUNTER — Emergency Department (HOSPITAL_COMMUNITY): Payer: Medicare Other

## 2014-06-15 DIAGNOSIS — R627 Adult failure to thrive: Secondary | ICD-10-CM | POA: Diagnosis present

## 2014-06-15 DIAGNOSIS — N200 Calculus of kidney: Secondary | ICD-10-CM

## 2014-06-15 DIAGNOSIS — I214 Non-ST elevation (NSTEMI) myocardial infarction: Secondary | ICD-10-CM

## 2014-06-15 DIAGNOSIS — N390 Urinary tract infection, site not specified: Secondary | ICD-10-CM | POA: Diagnosis present

## 2014-06-15 DIAGNOSIS — H0530 Unspecified deformity of orbit: Secondary | ICD-10-CM

## 2014-06-15 DIAGNOSIS — I509 Heart failure, unspecified: Secondary | ICD-10-CM

## 2014-06-15 DIAGNOSIS — D509 Iron deficiency anemia, unspecified: Secondary | ICD-10-CM | POA: Diagnosis not present

## 2014-06-15 DIAGNOSIS — N318 Other neuromuscular dysfunction of bladder: Secondary | ICD-10-CM

## 2014-06-15 DIAGNOSIS — G250 Essential tremor: Secondary | ICD-10-CM

## 2014-06-15 DIAGNOSIS — G252 Other specified forms of tremor: Secondary | ICD-10-CM

## 2014-06-15 DIAGNOSIS — E43 Unspecified severe protein-calorie malnutrition: Secondary | ICD-10-CM

## 2014-06-15 DIAGNOSIS — F17200 Nicotine dependence, unspecified, uncomplicated: Secondary | ICD-10-CM

## 2014-06-15 DIAGNOSIS — I255 Ischemic cardiomyopathy: Secondary | ICD-10-CM | POA: Diagnosis present

## 2014-06-15 DIAGNOSIS — Z66 Do not resuscitate: Secondary | ICD-10-CM | POA: Diagnosis present

## 2014-06-15 DIAGNOSIS — J441 Chronic obstructive pulmonary disease with (acute) exacerbation: Secondary | ICD-10-CM

## 2014-06-15 DIAGNOSIS — I739 Peripheral vascular disease, unspecified: Secondary | ICD-10-CM | POA: Diagnosis present

## 2014-06-15 DIAGNOSIS — I951 Orthostatic hypotension: Secondary | ICD-10-CM

## 2014-06-15 DIAGNOSIS — R7989 Other specified abnormal findings of blood chemistry: Secondary | ICD-10-CM

## 2014-06-15 DIAGNOSIS — I129 Hypertensive chronic kidney disease with stage 1 through stage 4 chronic kidney disease, or unspecified chronic kidney disease: Secondary | ICD-10-CM | POA: Diagnosis present

## 2014-06-15 DIAGNOSIS — F17210 Nicotine dependence, cigarettes, uncomplicated: Secondary | ICD-10-CM | POA: Diagnosis present

## 2014-06-15 DIAGNOSIS — R778 Other specified abnormalities of plasma proteins: Secondary | ICD-10-CM

## 2014-06-15 DIAGNOSIS — R531 Weakness: Secondary | ICD-10-CM | POA: Diagnosis not present

## 2014-06-15 DIAGNOSIS — N320 Bladder-neck obstruction: Secondary | ICD-10-CM

## 2014-06-15 DIAGNOSIS — I5031 Acute diastolic (congestive) heart failure: Secondary | ICD-10-CM

## 2014-06-15 DIAGNOSIS — E785 Hyperlipidemia, unspecified: Secondary | ICD-10-CM | POA: Diagnosis present

## 2014-06-15 DIAGNOSIS — I2510 Atherosclerotic heart disease of native coronary artery without angina pectoris: Secondary | ICD-10-CM | POA: Diagnosis present

## 2014-06-15 DIAGNOSIS — N183 Chronic kidney disease, stage 3 (moderate): Secondary | ICD-10-CM | POA: Diagnosis present

## 2014-06-15 DIAGNOSIS — R64 Cachexia: Secondary | ICD-10-CM | POA: Diagnosis present

## 2014-06-15 DIAGNOSIS — R55 Syncope and collapse: Secondary | ICD-10-CM

## 2014-06-15 DIAGNOSIS — I340 Nonrheumatic mitral (valve) insufficiency: Secondary | ICD-10-CM | POA: Diagnosis present

## 2014-06-15 DIAGNOSIS — R634 Abnormal weight loss: Secondary | ICD-10-CM

## 2014-06-15 DIAGNOSIS — J449 Chronic obstructive pulmonary disease, unspecified: Secondary | ICD-10-CM | POA: Diagnosis present

## 2014-06-15 DIAGNOSIS — F528 Other sexual dysfunction not due to a substance or known physiological condition: Secondary | ICD-10-CM

## 2014-06-15 DIAGNOSIS — D508 Other iron deficiency anemias: Secondary | ICD-10-CM

## 2014-06-15 DIAGNOSIS — D649 Anemia, unspecified: Secondary | ICD-10-CM | POA: Diagnosis present

## 2014-06-15 DIAGNOSIS — I252 Old myocardial infarction: Secondary | ICD-10-CM

## 2014-06-15 DIAGNOSIS — I480 Paroxysmal atrial fibrillation: Secondary | ICD-10-CM | POA: Diagnosis present

## 2014-06-15 DIAGNOSIS — I10 Essential (primary) hypertension: Secondary | ICD-10-CM

## 2014-06-15 DIAGNOSIS — Z681 Body mass index (BMI) 19 or less, adult: Secondary | ICD-10-CM

## 2014-06-15 LAB — CBC WITH DIFFERENTIAL/PLATELET
BASOS PCT: 0 % (ref 0–1)
Basophils Absolute: 0 10*3/uL (ref 0.0–0.1)
Eosinophils Absolute: 0 10*3/uL (ref 0.0–0.7)
Eosinophils Relative: 0 % (ref 0–5)
HCT: 20.5 % — ABNORMAL LOW (ref 39.0–52.0)
HEMOGLOBIN: 6.2 g/dL — AB (ref 13.0–17.0)
Lymphocytes Relative: 11 % — ABNORMAL LOW (ref 12–46)
Lymphs Abs: 1 10*3/uL (ref 0.7–4.0)
MCH: 21.8 pg — AB (ref 26.0–34.0)
MCHC: 30.2 g/dL (ref 30.0–36.0)
MCV: 71.9 fL — ABNORMAL LOW (ref 78.0–100.0)
MONO ABS: 0.8 10*3/uL (ref 0.1–1.0)
MONOS PCT: 9 % (ref 3–12)
NEUTROS PCT: 79 % — AB (ref 43–77)
Neutro Abs: 6.6 10*3/uL (ref 1.7–7.7)
Platelets: 193 10*3/uL (ref 150–400)
RBC: 2.85 MIL/uL — ABNORMAL LOW (ref 4.22–5.81)
RDW: 17.1 % — ABNORMAL HIGH (ref 11.5–15.5)
WBC: 8.4 10*3/uL (ref 4.0–10.5)

## 2014-06-15 LAB — URINALYSIS, ROUTINE W REFLEX MICROSCOPIC
Bilirubin Urine: NEGATIVE
Glucose, UA: NEGATIVE mg/dL
Ketones, ur: NEGATIVE mg/dL
Nitrite: POSITIVE — AB
Protein, ur: NEGATIVE mg/dL
SPECIFIC GRAVITY, URINE: 1.015 (ref 1.005–1.030)
Urobilinogen, UA: 0.2 mg/dL (ref 0.0–1.0)
pH: 6 (ref 5.0–8.0)

## 2014-06-15 LAB — URINE MICROSCOPIC-ADD ON

## 2014-06-15 LAB — I-STAT CG4 LACTIC ACID, ED: LACTIC ACID, VENOUS: 1.74 mmol/L (ref 0.5–2.2)

## 2014-06-15 MED ORDER — DEXTROSE 5 % IV SOLN
1.0000 g | Freq: Once | INTRAVENOUS | Status: AC
  Administered 2014-06-15: 1 g via INTRAVENOUS
  Filled 2014-06-15: qty 10

## 2014-06-15 NOTE — ED Notes (Signed)
CRITICAL VALUE ALERT  Critical value received:  Hemoglobin 6.2  Date of notification:  06/15/2014  Time of notification:  2344  Critical value read back: yes  Nurse who received alert:  Tenna DelaineLori Somara Frymire, RN  MD notified (1st page):  Dr. Blinda LeatherwoodPollina and Missy Sabinson Haymore, RN  Time of first page:  Face to Face at 2345  MD notified (2nd page):  Time of second page:  Responding MD:  Dr. Blinda LeatherwoodPollina  Time MD responded:  [phone]

## 2014-06-15 NOTE — ED Notes (Signed)
POC occult blood resulted negative, EDP notified.

## 2014-06-15 NOTE — ED Provider Notes (Signed)
CSN: [id number]     Arrival date & time 06/15/14  2216 History  This chart was scribed for Gilda Crease, * by Modena Jansky, ED Scribe. This patient was seen in room APA09/APA09 and the patient's care was started at 10:23 PM.   Chief Complaint  Patient presents with  . Weakness   The history is provided by the patient. No language interpreter was used.   HPI Comments: Jeffrey [name] is a 80 y.o. male who presents to the Emergency Department complaining of generalized weakness that started today. Pt's friend states that pt has been having some weakness for a while now, but today it has worsened. Pt reports associated dizziness. He denies any pain in his body. He also denies any diarrhea, and cough.  Past Medical History  Diagnosis Date  . CAD (coronary artery disease)     a. NSTEMI 2010 felt due to AF-RVR in setting of multivessel CAD, not surgical candidate. b. NSTEMI trop >20, 05/2013 - 3v CAD incl LM - for med rx.  . MI, old   . Mitral valvular regurgitation     a. Mod by echo 10/20114.  . Atrial fibrillation   . Hypertension   . Syncope     a. 05/2013 admission: felt due to NSTEMI vs orthostasis.  Marland Kitchen NSVT (nonsustained ventricular tachycardia)     a. 05/2013 admission on tele. No sustained arrhythmias.  . Severe protein-calorie malnutrition   . Chronic anemia     a. s/p transfusion 05/2013.  . Orthostatic hypotension     a. During 05/2013 admission (BP 147->84 lying to sitting w/ associated dizziness).  . Paroxysmal a-fib   . COPD (chronic obstructive pulmonary disease)   . Hyperlipidemia   . PAD (peripheral artery disease)    Past Surgical History  Procedure Laterality Date  . Coronary angioplasty with stent placement     No family history on file. History  Substance Use Topics  . Smoking status: Current Some Day Smoker -- 0.50 packs/day for 60 years    Types: Cigarettes  . Smokeless tobacco: Not on file  . Alcohol Use: Yes     Comment: occ    Review  of Systems  Respiratory: Negative for cough.   Gastrointestinal: Negative for diarrhea.  Neurological: Positive for dizziness and weakness.  All other systems reviewed and are negative.   Allergies  Review of patient's allergies indicates no known allergies.  Home Medications   Prior to Admission medications   Medication Sig Start Date End Date Taking? Authorizing Provider  naproxen sodium (ANAPROX) 220 MG tablet Take 220 mg by mouth daily as needed (for pain).   Yes Historical Provider, MD  albuterol-ipratropium (COMBIVENT) 18-103 MCG/ACT inhaler Inhale 1-2 puffs into the lungs every 6 (six) hours as needed for wheezing or shortness of breath. 11/16/13   Jodelle Gross, NP  atorvastatin (LIPITOR) 40 MG tablet Take 1 tablet (40 mg total) by mouth every evening. 06/08/13   Dayna N Dunn, PA-C  clopidogrel (PLAVIX) 75 MG tablet Take 1 tablet (75 mg total) by mouth daily. 02/14/14   Donita Brooks, MD  furosemide (LASIX) 40 MG tablet Take 40 mg by mouth daily.    Historical Provider, MD  metoprolol succinate (TOPROL-XL) 25 MG 24 hr tablet Take 1 tablet (25 mg total) by mouth daily. 02/14/14   Donita Brooks, MD  nitroGLYCERIN (NITROSTAT) 0.4 MG SL tablet Place 1 tablet (0.4 mg total) under the tongue every 5 (five) minutes as needed for chest  pain (up to 3 doses). 02/14/14   Donita BrooksWarren T Pickard, MD  pantoprazole (PROTONIX) 40 MG tablet Take 1 tablet (40 mg total) by mouth daily. 06/08/13   Dayna N Dunn, PA-C   BP 120/78  Pulse 98  Temp(Src) 99.3 F (37.4 C) (Oral)  Resp 18  SpO2 100% Physical Exam  Nursing note and vitals reviewed. Constitutional: He is oriented to person, place, and time. He appears well-developed and well-nourished. No distress.  HENT:  Head: Normocephalic and atraumatic.  Right Ear: Hearing normal.  Left Ear: Hearing normal.  Nose: Nose normal.  Mouth/Throat: Oropharynx is clear and moist and mucous membranes are normal.  Eyes: Conjunctivae and EOM are normal.  Pupils are equal, round, and reactive to light.  Neck: Normal range of motion. Neck supple.  Cardiovascular: Regular rhythm, S1 normal and S2 normal.  Exam reveals no gallop and no friction rub.   No murmur heard. Pulmonary/Chest: Effort normal and breath sounds normal. No respiratory distress. He exhibits no tenderness.  Abdominal: Soft. Normal appearance and bowel sounds are normal. There is no hepatosplenomegaly. There is no tenderness. There is no rebound, no guarding, no tenderness at McBurney's point and negative Murphy's sign. No hernia.  Musculoskeletal: Normal range of motion.  Neurological: He is alert and oriented to person, place, and time. He has normal strength. No cranial nerve deficit or sensory deficit. Coordination normal. GCS eye subscore is 4. GCS verbal subscore is 5. GCS motor subscore is 6.  Skin: Skin is warm, dry and intact. No rash noted. No cyanosis.  Psychiatric: He has a normal mood and affect. His speech is normal and behavior is normal. Thought content normal.    ED Course  Procedures (including critical care time) DIAGNOSTIC STUDIES:   COORDINATION OF CARE: 10:27 PM- Pt advised of plan for treatment which includes radiology and labs and pt agrees.  Labs Review Labs Reviewed  CBC WITH DIFFERENTIAL - Abnormal; Notable for the following:    RBC 2.85 (*)    Hemoglobin 6.2 (*)    HCT 20.5 (*)    MCV 71.9 (*)    MCH 21.8 (*)    RDW 17.1 (*)    Neutrophils Relative % 79 (*)    Lymphocytes Relative 11 (*)    All other components within normal limits  URINALYSIS, ROUTINE W REFLEX MICROSCOPIC - Abnormal; Notable for the following:    APPearance HAZY (*)    Hgb urine dipstick TRACE (*)    Nitrite POSITIVE (*)    Leukocytes, UA TRACE (*)    All other components within normal limits  URINE MICROSCOPIC-ADD ON - Abnormal; Notable for the following:    Bacteria, UA MANY (*)    All other components within normal limits  URINE CULTURE  CULTURE, BLOOD (ROUTINE X  2)  CULTURE, BLOOD (ROUTINE X 2)  COMPREHENSIVE METABOLIC PANEL  TROPONIN I  PRO B NATRIURETIC PEPTIDE  I-STAT CG4 LACTIC ACID, ED  POC OCCULT BLOOD, ED  TYPE AND SCREEN    Imaging Review Dg Chest Port 1 View  06/15/2014   CLINICAL DATA:  Weakness and cough for 1 day initial evaluation  EXAM: PORTABLE CHEST - 1 VIEW  COMPARISON:  10/22/2013  FINDINGS: Mild cardiac enlargement. Vascular pattern normal. Mild right base atelectasis. Retrocardiac opacity.  IMPRESSION: Retrocardiac opacity could represent consolidation and/or effusion.   Electronically Signed   By: Esperanza Heiraymond  Rubner M.D.   On: 06/15/2014 23:22     EKG Interpretation   Date/Time:  Saturday June 15 2014 22:25:34 EDT Ventricular Rate:  97 PR Interval:  178 QRS Duration: 140 QT Interval:  397 QTC Calculation: 504 R Axis:   92 Text Interpretation:  Sinus rhythm RBBB and LPFB Probable anteroseptal  infarct, old Repol abnrm suggests ischemia, diffuse leads No significant  change since last tracing Confirmed by POLLINA  MD, CHRISTOPHER [phone]) on  06/15/2014 11:03:46 PM      MDM   Final diagnoses:  Weakness  UTI (lower urinary tract infection)  Anemia, unspecified anemia type   Patient presents to the ER for evaluation of generalized weakness. Patient reports that he has not had any energy and has felt slightly weak and dizzy. He has not had any nausea, vomiting, diarrhea. He denies fever, cough, shortness of breath. He has not had any chest pain or pain of any kind.  Workup reveals evidence of urinary tract infection. Administered Rocephin, culture sent. Patient also has significant anemia of unclear etiology. Rectal exam was negative for occult blood.  Patient is in hospice currently. He does agree, however, that he would take is offered. Will admit to the hospitalist for further management of UTI and anemia.  I personally performed the services described in this documentation, which was scribed in my presence.  The recorded information has been reviewed and is accurate.      Gilda Creasehristopher J. Pollina, MD 06/15/14 [phone]

## 2014-06-16 DIAGNOSIS — I129 Hypertensive chronic kidney disease with stage 1 through stage 4 chronic kidney disease, or unspecified chronic kidney disease: Secondary | ICD-10-CM | POA: Diagnosis present

## 2014-06-16 DIAGNOSIS — R7989 Other specified abnormal findings of blood chemistry: Secondary | ICD-10-CM

## 2014-06-16 DIAGNOSIS — Z66 Do not resuscitate: Secondary | ICD-10-CM | POA: Diagnosis present

## 2014-06-16 DIAGNOSIS — I2510 Atherosclerotic heart disease of native coronary artery without angina pectoris: Secondary | ICD-10-CM | POA: Diagnosis present

## 2014-06-16 DIAGNOSIS — I340 Nonrheumatic mitral (valve) insufficiency: Secondary | ICD-10-CM | POA: Diagnosis present

## 2014-06-16 DIAGNOSIS — E785 Hyperlipidemia, unspecified: Secondary | ICD-10-CM | POA: Diagnosis present

## 2014-06-16 DIAGNOSIS — D638 Anemia in other chronic diseases classified elsewhere: Secondary | ICD-10-CM | POA: Diagnosis not present

## 2014-06-16 DIAGNOSIS — I739 Peripheral vascular disease, unspecified: Secondary | ICD-10-CM | POA: Diagnosis present

## 2014-06-16 DIAGNOSIS — I480 Paroxysmal atrial fibrillation: Secondary | ICD-10-CM | POA: Diagnosis present

## 2014-06-16 DIAGNOSIS — F17210 Nicotine dependence, cigarettes, uncomplicated: Secondary | ICD-10-CM | POA: Diagnosis present

## 2014-06-16 DIAGNOSIS — R64 Cachexia: Secondary | ICD-10-CM | POA: Diagnosis present

## 2014-06-16 DIAGNOSIS — J449 Chronic obstructive pulmonary disease, unspecified: Secondary | ICD-10-CM | POA: Diagnosis present

## 2014-06-16 DIAGNOSIS — N183 Chronic kidney disease, stage 3 (moderate): Secondary | ICD-10-CM | POA: Diagnosis present

## 2014-06-16 DIAGNOSIS — D509 Iron deficiency anemia, unspecified: Secondary | ICD-10-CM | POA: Diagnosis present

## 2014-06-16 DIAGNOSIS — I255 Ischemic cardiomyopathy: Secondary | ICD-10-CM | POA: Diagnosis present

## 2014-06-16 DIAGNOSIS — N390 Urinary tract infection, site not specified: Secondary | ICD-10-CM | POA: Diagnosis present

## 2014-06-16 DIAGNOSIS — R627 Adult failure to thrive: Secondary | ICD-10-CM | POA: Diagnosis present

## 2014-06-16 DIAGNOSIS — R531 Weakness: Secondary | ICD-10-CM

## 2014-06-16 DIAGNOSIS — R778 Other specified abnormalities of plasma proteins: Secondary | ICD-10-CM | POA: Diagnosis present

## 2014-06-16 DIAGNOSIS — D508 Other iron deficiency anemias: Secondary | ICD-10-CM

## 2014-06-16 DIAGNOSIS — D649 Anemia, unspecified: Secondary | ICD-10-CM | POA: Diagnosis present

## 2014-06-16 DIAGNOSIS — Z515 Encounter for palliative care: Secondary | ICD-10-CM | POA: Diagnosis not present

## 2014-06-16 DIAGNOSIS — Z681 Body mass index (BMI) 19 or less, adult: Secondary | ICD-10-CM | POA: Diagnosis not present

## 2014-06-16 DIAGNOSIS — I252 Old myocardial infarction: Secondary | ICD-10-CM | POA: Diagnosis not present

## 2014-06-16 LAB — COMPREHENSIVE METABOLIC PANEL
ALBUMIN: 3.4 g/dL — AB (ref 3.5–5.2)
ALT: 12 U/L (ref 0–53)
ANION GAP: 12 (ref 5–15)
AST: 27 U/L (ref 0–37)
Alkaline Phosphatase: 72 U/L (ref 39–117)
BUN: 32 mg/dL — AB (ref 6–23)
CO2: 24 mEq/L (ref 19–32)
CREATININE: 1.17 mg/dL (ref 0.50–1.35)
Calcium: 8.9 mg/dL (ref 8.4–10.5)
Chloride: 106 mEq/L (ref 96–112)
GFR calc Af Amer: 60 mL/min — ABNORMAL LOW (ref >90)
GFR calc non Af Amer: 52 mL/min — ABNORMAL LOW (ref >90)
Glucose, Bld: 129 mg/dL — ABNORMAL HIGH (ref 70–99)
POTASSIUM: 4 meq/L (ref 3.7–5.3)
Sodium: 142 mEq/L (ref 137–147)
TOTAL PROTEIN: 7.2 g/dL (ref 6.0–8.3)
Total Bilirubin: 0.3 mg/dL (ref 0.3–1.2)

## 2014-06-16 LAB — PREPARE RBC (CROSSMATCH)

## 2014-06-16 LAB — PRO B NATRIURETIC PEPTIDE: PRO B NATRI PEPTIDE: 12148 pg/mL — AB (ref 0–450)

## 2014-06-16 LAB — CBC
HCT: 22.4 % — ABNORMAL LOW (ref 39.0–52.0)
Hemoglobin: 6.9 g/dL — CL (ref 13.0–17.0)
MCH: 21.8 pg — AB (ref 26.0–34.0)
MCHC: 30.8 g/dL (ref 30.0–36.0)
MCV: 70.7 fL — ABNORMAL LOW (ref 78.0–100.0)
PLATELETS: 185 10*3/uL (ref 150–400)
RBC: 3.17 MIL/uL — ABNORMAL LOW (ref 4.22–5.81)
RDW: 16.7 % — ABNORMAL HIGH (ref 11.5–15.5)
WBC: 8.2 10*3/uL (ref 4.0–10.5)

## 2014-06-16 LAB — TYPE AND SCREEN
ABO/RH(D): O POS
ANTIBODY SCREEN: POSITIVE

## 2014-06-16 LAB — TROPONIN I
TROPONIN I: 0.81 ng/mL — AB (ref <0.30)
Troponin I: 0.78 ng/mL (ref <0.30)

## 2014-06-16 MED ORDER — INFLUENZA VAC SPLIT QUAD 0.5 ML IM SUSY
0.5000 mL | PREFILLED_SYRINGE | INTRAMUSCULAR | Status: AC
  Administered 2014-06-17: 0.5 mL via INTRAMUSCULAR
  Filled 2014-06-16: qty 0.5

## 2014-06-16 MED ORDER — DEXTROSE 5 % IV SOLN: 1.0000 g | INTRAVENOUS | Status: DC

## 2014-06-16 MED ORDER — METOPROLOL SUCCINATE ER 25 MG PO TB24
25.0000 mg | ORAL_TABLET | Freq: Every day | ORAL | Status: DC
  Filled 2014-06-16 (×2): qty 1

## 2014-06-16 MED ORDER — SODIUM CHLORIDE 0.9 % IJ SOLN
3.0000 mL | Freq: Two times a day (BID) | INTRAMUSCULAR | Status: DC
  Administered 2014-06-16 – 2014-06-21 (×6): 3 mL via INTRAVENOUS

## 2014-06-16 MED ORDER — CLOPIDOGREL BISULFATE 75 MG PO TABS
75.0000 mg | ORAL_TABLET | Freq: Every day | ORAL | Status: DC
  Filled 2014-06-16: qty 1

## 2014-06-16 MED ORDER — PANTOPRAZOLE SODIUM 40 MG PO TBEC
40.0000 mg | DELAYED_RELEASE_TABLET | Freq: Every day | ORAL | Status: DC
  Administered 2014-06-16 – 2014-06-20 (×5): 40 mg via ORAL
  Filled 2014-06-16 (×6): qty 1

## 2014-06-16 MED ORDER — HEPARIN SODIUM (PORCINE) 5000 UNIT/ML IJ SOLN
5000.0000 [IU] | Freq: Three times a day (TID) | INTRAMUSCULAR | Status: DC
  Administered 2014-06-16 – 2014-06-21 (×16): 5000 [IU] via SUBCUTANEOUS
  Filled 2014-06-16 (×21): qty 1

## 2014-06-16 MED ORDER — FUROSEMIDE 40 MG PO TABS
40.0000 mg | ORAL_TABLET | Freq: Every day | ORAL | Status: DC
  Filled 2014-06-16: qty 1

## 2014-06-16 MED ORDER — ASPIRIN EC 325 MG PO TBEC: 325.0000 mg | DELAYED_RELEASE_TABLET | Freq: Every day | ORAL | Status: DC

## 2014-06-16 MED ORDER — SODIUM CHLORIDE 0.9 % IV SOLN
Freq: Once | INTRAVENOUS | Status: AC
Start: 2014-06-16 — End: 2014-06-16
  Administered 2014-06-16: 10 mL/h via INTRAVENOUS

## 2014-06-16 MED ORDER — NITROGLYCERIN 0.4 MG SL SUBL: 0.4000 mg | SUBLINGUAL_TABLET | SUBLINGUAL | Status: DC | PRN

## 2014-06-16 MED ORDER — SODIUM CHLORIDE 0.9 % IV SOLN
Freq: Once | INTRAVENOUS | Status: AC
  Administered 2014-06-16: 10 mL/h via INTRAVENOUS

## 2014-06-16 MED ORDER — DEXTROSE 5 % IV SOLN
1.0000 g | INTRAVENOUS | Status: DC
  Administered 2014-06-17 – 2014-06-18 (×3): 1 g via INTRAVENOUS
  Filled 2014-06-16 (×4): qty 10

## 2014-06-16 MED ORDER — ATORVASTATIN CALCIUM 40 MG PO TABS
40.0000 mg | ORAL_TABLET | Freq: Every evening | ORAL | Status: DC
  Filled 2014-06-16: qty 1

## 2014-06-16 NOTE — Progress Notes (Signed)
[facility] hospitalist progress note. Chief complaint. Transfer note. History of present illness. This 80 year old male presented to [facility] emergency room with complaints of generalized weakness. Denied melena or bright red blood per rectum or other type of bleeding. Found to have a hemoglobin of 6.2. Troponins also elevated 0.78 0.81 respectively. This thought secondary to demand ischemia. The patient was felt to [facility] to allow for cardiology consult. Patient also is to be transfused 2 units of packed red blood cells. Patient has now arrived in transfer and I'm seeing him at bedside to ensure he remained clinically stable and that his orders transferred appropriately. Patient has no current complaints. Vital signs. Temperature 99.3, pulse 86, respiration 18, blood pressure 110/67. O2 sats 92%. General appearance. Frail appearing elderly male who is alert and in no distress. Cardiac. Rate and rhythm regular. Lungs. Breath sounds clear and equal. Abdomen. Soft with positive bowel sounds. No pain. Skin. Patient appears pale. Lips, conjunctiva, oral mucosa, and nailbeds all pale. Impression/plan. Problem #1. Symptomatic anemia. And That likely secondary to acute on chronic etiology Patient for transfusion 2 units packed red blood cells. Followup hemoglobin in a.m. Problem #2. Elevated troponin. This was [facility] with [facility]. No heparin at this time. Cardiology will follow in consult later this a.m. Problem #3. UTI. Treatment with Rocephin. Urine and blood cultures pending. Patient appears clinically stable post transfer. All orders appear to of transferred appropriately. Problem #3..Marland Kitchen

## 2014-06-16 NOTE — H&P (Signed)
[facility] History and Physical  TREBOR GALDAMEZ ZOX:[id number] DOB: [birth year] DOA: 06/15/2014  Referring physician: EDP PCP: Leo Grosser, MD   Chief Complaint: Generalized weakness   HPI: Jeffrey [name] is a 80 y.o. male who presents to the ED with c/o generalized weakness.  Symptoms got significantly worse today though he has been weak for some time now.  Patient reports associated dizziness.  Denies pain and denies chest pain specifically.  No melena, BRBPR, or bleeding from anywhere.  Review of Systems: Systems reviewed.  As above, otherwise negative  Past Medical History  Diagnosis Date  . CAD (coronary artery disease)     a. NSTEMI 2010 felt due to AF-RVR in setting of multivessel CAD, not surgical candidate. b. NSTEMI trop >20, 05/2013 - 3v CAD incl LM - for med rx.  . MI, old   . Mitral valvular regurgitation     a. Mod by echo 10/20114.  . Atrial fibrillation   . Hypertension   . Syncope     a. 05/2013 admission: felt due to NSTEMI vs orthostasis.  Marland Kitchen NSVT (nonsustained ventricular tachycardia)     a. 05/2013 admission on tele. No sustained arrhythmias.  . Severe protein-calorie malnutrition   . Chronic anemia     a. s/p transfusion 05/2013.  . Orthostatic hypotension     a. During 05/2013 admission (BP 147->84 lying to sitting w/ associated dizziness).  . Paroxysmal a-fib   . COPD (chronic obstructive pulmonary disease)   . Hyperlipidemia   . PAD (peripheral artery disease)    Past Surgical History  Procedure Laterality Date  . Coronary angioplasty with stent placement     Social History:  reports that he has been smoking Cigarettes.  He has a 30 pack-year smoking history. He does not have any smokeless tobacco history on file. He reports that he drinks alcohol. He reports that he does not use illicit drugs.  No Known Allergies  No family history on file.   Prior to Admission medications   Medication Sig Start Date End Date Taking?  Authorizing Provider  naproxen sodium (ANAPROX) 220 MG tablet Take 220 mg by mouth daily as needed (for pain).   Yes Historical Provider, MD  albuterol-ipratropium (COMBIVENT) 18-103 MCG/ACT inhaler Inhale 1-2 puffs into the lungs every 6 (six) hours as needed for wheezing or shortness of breath. 11/16/13   Jodelle Gross, NP  atorvastatin (LIPITOR) 40 MG tablet Take 1 tablet (40 mg total) by mouth every evening. 06/08/13   Dayna N Dunn, PA-C  clopidogrel (PLAVIX) 75 MG tablet Take 1 tablet (75 mg total) by mouth daily. 02/14/14   Donita Brooks, MD  furosemide (LASIX) 40 MG tablet Take 40 mg by mouth daily.    Historical Provider, MD  metoprolol succinate (TOPROL-XL) 25 MG 24 hr tablet Take 1 tablet (25 mg total) by mouth daily. 02/14/14   Donita Brooks, MD  nitroGLYCERIN (NITROSTAT) 0.4 MG SL tablet Place 1 tablet (0.4 mg total) under the tongue every 5 (five) minutes as needed for chest pain (up to 3 doses). 02/14/14   Donita Brooks, MD  pantoprazole (PROTONIX) 40 MG tablet Take 1 tablet (40 mg total) by mouth daily. 06/08/13   Laurann Montana, PA-C   Physical Exam: Filed Vitals:   06/16/14 0130  BP: 118/75  Pulse: 90  Temp:   Resp: 16    BP 118/75  Pulse 90  Temp(Src) 99.3 F (37.4 C) (Oral)  Resp 16  SpO2 94%  General Appearance:    Alert, oriented, cachectic, no distress, appears stated age  Head:    Normocephalic, atraumatic  Eyes:    PERRL, EOMI, sclera non-icteric        Nose:   Nares without drainage or epistaxis. Mucosa, turbinates normal  Throat:   Moist mucous membranes. Oropharynx without erythema or exudate.  Neck:   Supple. No carotid bruits.  No thyromegaly.  No lymphadenopathy.   Back:     No CVA tenderness, no spinal tenderness  Lungs:     Clear to auscultation bilaterally, without wheezes, rhonchi or rales  Chest wall:    No tenderness to palpitation  Heart:    Regular rate and rhythm without murmurs, gallops, rubs  Abdomen:     Soft, non-tender,  nondistended, normal bowel sounds, no organomegaly  Genitalia:    deferred  Rectal:    deferred  Extremities:   No clubbing, cyanosis or edema.  Pulses:   2+ and symmetric all extremities  Skin:   Skin color, texture, turgor normal, no rashes or lesions  Lymph nodes:   Cervical, supraclavicular, and axillary nodes normal  Neurologic:   CNII-XII intact. Normal strength, sensation and reflexes      throughout    Labs on Admission:  Basic Metabolic Panel:  Recent Labs Lab 06/15/14 2302  NA 142  K 4.0  CL 106  CO2 24  GLUCOSE 129*  BUN 32*  CREATININE 1.17  CALCIUM 8.9   Liver Function Tests:  Recent Labs Lab 06/15/14 2302  AST 27  ALT 12  ALKPHOS 72  BILITOT 0.3  PROT 7.2  ALBUMIN 3.4*   No results found for this basename: LIPASE, AMYLASE,  in the last 168 hours No results found for this basename: AMMONIA,  in the last 168 hours CBC:  Recent Labs Lab 06/15/14 2302  WBC 8.4  NEUTROABS 6.6  HGB 6.2*  HCT 20.5*  MCV 71.9*  PLT 193   Cardiac Enzymes:  Recent Labs Lab 06/15/14 2302 06/16/14 0034  TROPONINI 0.78* 0.81*    BNP (last 3 results)  Recent Labs  10/21/13 1144 06/15/14 2302  PROBNP 4163.0* 12148.0*   CBG: No results found for this basename: GLUCAP,  in the last 168 hours  Radiological Exams on Admission: Dg Chest Port 1 View  06/15/2014   CLINICAL DATA:  Weakness and cough for 1 day initial evaluation  EXAM: PORTABLE CHEST - 1 VIEW  COMPARISON:  10/22/2013  FINDINGS: Mild cardiac enlargement. Vascular pattern normal. Mild right base atelectasis. Retrocardiac opacity.  IMPRESSION: Retrocardiac opacity could represent consolidation and/or effusion.   Electronically Signed   By: Esperanza Heiraymond  Rubner M.D.   On: 06/15/2014 23:22    EKG: Independently reviewed.  Assessment/Plan Principal Problem:   Symptomatic anemia Active Problems:   Anemia   UTI (lower urinary tract infection)   Elevated troponin   1. Symptomatic anemia - anemia is  acute on chronic, likely due to malnutrition. 1. Transfuse 2 units PRBC 2. Recheck labs in AM 2. Elevated troponin - Patient has elevated troponin (0.79 initially then 0.81 an hour and a half later).  I suspect that this as well as his EKG changes represent demand ischemia on top of his known CAD (severe triple vessel disease demonstrated on cath Oct of last year) due to the anemia. 1. Treat anemia 2. [facility] (not available at [facility] on weekend).  I spoke [facility] over phone this evening to  discuss with him: 1. He does not want me to put the patient on heparin gtt at this time (plaque wall rupture is less likely than demand ischemia) 3. Serial trops ordered. 4. Tele monitor 3. UTI - treating with rocephin   Code Status: Full Code - I did attempt to persuade patient away from this, especially seeing as how he is on hospice, he will consider changing code status but wants to be full code for now. Family Communication: Significant other at bedside Disposition Plan: Admit to inpatient   Time spent: 70 min  Aldan Camey M. [facility] Pager [phone]  If 7AM-7PM, please contact the day team taking care of the patient [email or web address] Password [email or web address] 06/16/2014, 1:50 AM

## 2014-06-16 NOTE — Progress Notes (Signed)
Pt received 2units of RBC; no allergic reaction noted during shift. Pt alert and responsive; pt impulsive but follow command in bed with significant other and sitter at bedside. Reported off to incoming RN. Arabella MerlesP. Amo Diann Bangerter RN.

## 2014-06-16 NOTE — Consult Note (Signed)
Admit date: 06/15/2014 Referring Physician  Dr. Julian ReilGardner Primary Physician Lynnea FerrierPICKARD,WARREN TOM, MD Primary Cardiologist  N/A Reason for Consultation  Troponin elevation  HPI: [name] is a 80 y.o male with dx of pAF, COPD, hyperlipidemia, NSTEMI in [place] 2014, 3V CAD not amenable to PCI (pt not a surgical candidate) who presents with chief complaint of fatigue and weakness   Pt is confused and not able to provide accurate HPI. His girlfriend is at the bedside, she is also having trouble providing specifics about his presentation. Pt has been fatigued and was noted to be confused on Saturday. Girlfriend also noticed that he was trembling and was not as communicative. Hence, she called ambulance and brought him to the hospital. Pt walks minimally at home. He has had no complaints of angina. He takes all of his medications as prescribed. ROS neg for fever, chills, nausea, vomiting, abd pain, chest pain, palpitations, LE edema, PND, DOE. His labs today show Hgb of 6.2. Trop 0.78->0.81. Cr 1.17. Lactic acid 1.74. UA with 21-50 WBCs and positive for nitrite and leukocytes. CXR with minimal interstitial edema. ECG today shows ST depression in lateral leads.   Previously in Oct 2014, pt was noted to have NSTEMI and cath showed LMCA and 3V CAD. Medical management chosen - pt discharged with ASA 81mg , atorvastatin 40mg , plavix 75, toprol xl 25.   Cath 05/2013 - dLM 70%, oLAD 80 heavily calcified, mcirc 90, mRCA 100. 2D Echo 06/06/13 - Left ventricle: Hypokinesis of the inferolateral wall and hypokinesis of the base inferior and mid-inferoseptal. The cavity size was normal. Wall thickness was increased in a pattern of mild LVH. The estimated ejection fraction was 50%. Findings consistent with left ventricular diastolic dysfunction. - Mitral valve: Anterior mitral valve thickening. Probable mild prolapse of anterior leaflet. Posteriorly directed moderate MR. - Left atrium: The atrium was moderately dilated. -  Pulmonary arteries: PA peak pressure: 69mm Hg (S). - Impressions: THE WALL MOTION ABNORMALITIES ARE NEW SINCE 02/2013. THE MR IS OLD.   PMH:   Past Medical History  Diagnosis Date  . CAD (coronary artery disease)     a. NSTEMI 2010 felt due to AF-RVR in setting of multivessel CAD, not surgical candidate. b. NSTEMI trop >20, 05/2013 - 3v CAD incl LM - for med rx.  . MI, old   . Mitral valvular regurgitation     a. Mod by echo 10/20114.  . Atrial fibrillation   . Hypertension   . Syncope     a. 05/2013 admission: felt due to NSTEMI vs orthostasis.  Marland Kitchen. NSVT (nonsustained ventricular tachycardia)     a. 05/2013 admission on tele. No sustained arrhythmias.  . Severe protein-calorie malnutrition   . Chronic anemia     a. s/p transfusion 05/2013.  . Orthostatic hypotension     a. During 05/2013 admission (BP 147->84 lying to sitting w/ associated dizziness).  . Paroxysmal a-fib   . COPD (chronic obstructive pulmonary disease)   . Hyperlipidemia   . PAD (peripheral artery disease)     PSH:   Past Surgical History  Procedure Laterality Date  . Coronary angioplasty with stent placement     Allergies:  Review of patient's allergies indicates no known allergies. Prior to Admit Meds:   Prior to Admission medications   Medication Sig Start Date End Date Taking? Authorizing Provider  naproxen sodium (ANAPROX) 220 MG tablet Take 220 mg by mouth daily as needed (for pain).   Yes Historical Provider, MD  albuterol-ipratropium (COMBIVENT)  18-103 MCG/ACT inhaler Inhale 1-2 puffs into the lungs every 6 (six) hours as needed for wheezing or shortness of breath. 11/16/13   Jodelle GrossKathryn M Lawrence, NP  atorvastatin (LIPITOR) 40 MG tablet Take 1 tablet (40 mg total) by mouth every evening. 06/08/13   Dayna N Dunn, PA-C  clopidogrel (PLAVIX) 75 MG tablet Take 1 tablet (75 mg total) by mouth daily. 02/14/14   Donita BrooksWarren T Pickard, MD  furosemide (LASIX) 40 MG tablet Take 40 mg by mouth daily.    Historical Provider,  MD  metoprolol succinate (TOPROL-XL) 25 MG 24 hr tablet Take 1 tablet (25 mg total) by mouth daily. 02/14/14   Donita BrooksWarren T Pickard, MD  nitroGLYCERIN (NITROSTAT) 0.4 MG SL tablet Place 1 tablet (0.4 mg total) under the tongue every 5 (five) minutes as needed for chest pain (up to 3 doses). 02/14/14   Donita BrooksWarren T Pickard, MD  pantoprazole (PROTONIX) 40 MG tablet Take 1 tablet (40 mg total) by mouth daily. 06/08/13   Dayna N Dunn, PA-C   Fam HX:   No family history on file. Social HX:    History   Social History  . Marital Status: Widowed    Spouse Name: N/A    Number of Children: N/A  . Years of Education: N/A   Occupational History  . Not on file.   Social History Main Topics  . Smoking status: Current Some Day Smoker -- 0.50 packs/day for 60 years    Types: Cigarettes  . Smokeless tobacco: Not on file  . Alcohol Use: Yes     Comment: occ  . Drug Use: No  . Sexual Activity: Not on file   Other Topics Concern  . Not on file   Social History Narrative  . No narrative on file     ROS:  All 11 ROS were addressed and are negative except what is stated in the HPI   Physical Exam: Blood pressure 110/67, pulse 86, temperature 99.3 F (37.4 C), temperature source Oral, resp. rate 18, SpO2 92.00%.   Gen - pt is alert. But not oriented. Cachectic appearance. Pale appearance PERRL, EOMI Neck - flat JVP Dry oropharynx CV - 3/6 holosystolic murmur at apex, s1s2 heard Lungs - ctab Abd - +bs, nd, nt Ext - no edema     Labs: Lab Results  Component Value Date   WBC 8.4 06/15/2014   HGB 6.2* 06/15/2014   HCT 20.5* 06/15/2014   MCV 71.9* 06/15/2014   PLT 193 06/15/2014     Recent Labs Lab 06/15/14 2302  NA 142  K 4.0  CL 106  CO2 24  BUN 32*  CREATININE 1.17  CALCIUM 8.9  PROT 7.2  BILITOT 0.3  ALKPHOS 72  ALT 12  AST 27  GLUCOSE 129*    Recent Labs  06/15/14 2302 06/16/14 0034  TROPONINI 0.78* 0.81*   Lab Results  Component Value Date   CHOL 140 01/16/2009     HDL 44 01/16/2009   LDLCALC 79 01/16/2009   TRIG 87 01/16/2009   No results found for this basename: DDIMER     Radiology:  Dg Chest Port 1 View  06/15/2014   CLINICAL DATA:  Weakness and cough for 1 day initial evaluation  EXAM: PORTABLE CHEST - 1 VIEW  COMPARISON:  10/22/2013  FINDINGS: Mild cardiac enlargement. Vascular pattern normal. Mild right base atelectasis. Retrocardiac opacity.  IMPRESSION: Retrocardiac opacity could represent consolidation and/or effusion.   Electronically Signed   By: Edgar Friskaymond  Rubner M.D.  On: 06/15/2014 23:22   Personally viewed.  EKG:  NSR, RBBB  ASSESSMENT Troponin elevation without ACS Anemia Urinary Tract Infection CAD, medical management Ischemic Cardiomyopathy Moderate Mitral Regurgitation Hospice Care  PLAN:   - Agree with PRBC and UTI tx per primary team - No indication to anti-coagulate for ACS - Recommend stopping atorvastatin 40mg , plavix 75mg , ASA 325mg  considering his malnourished state and anemia.  - Pt is dry on exam and ok to hold lasix until fluid resuscitated - Discharge with lasix as needed for symptomatic relief - cont toprol xl 25mg  po QD for angina relief - Recommend continuation of Hospice care.  Thank you for this consult.   Deeann Cree, MD  06/16/2014  3:34 AM

## 2014-06-16 NOTE — Progress Notes (Signed)
Lab called with critical Hgb of 6.9, lab was informed pt blood was drawn after transfusing 1unit and pt still had another 1unit of blood to receive but will inform MD. Dr. Blake DivineAkula on unit notified no new order received.  Will continue to monitor pt quietly. Jeffrey [name]. Amo Krist Rosenboom RN.

## 2014-06-16 NOTE — Progress Notes (Signed)
80 year old male presented to [name] room with complaints of generalized weakness. Found to have profound anemia and UTI. He was ordered 2 units of prbc transfusion and started on rocephin for the UTI. Pt/OT eval tomorrow.  Cardiology consulted for elevated troponin. No further cardiac work up anticipated.    Kathlen ModyVijaya Jani Moronta, MD [phone]

## 2014-06-16 NOTE — ED Notes (Signed)
CRITICAL VALUE ALERT  Critical value received:  Troponin 0.78  Date of notification:  06/16/14  Time of notification:  0008  Critical value read back:Yes.    Nurse who received alert:  Kathlene CoteJamie Kayley Zeiders, RN  MD notified (1st page):  Dr. Blinda LeatherwoodPollina  Time of first page:  0008

## 2014-06-17 ENCOUNTER — Inpatient Hospital Stay (HOSPITAL_COMMUNITY): Payer: Medicare Other

## 2014-06-17 ENCOUNTER — Encounter (HOSPITAL_COMMUNITY): Payer: Self-pay | Admitting: General Practice

## 2014-06-17 DIAGNOSIS — I480 Paroxysmal atrial fibrillation: Secondary | ICD-10-CM

## 2014-06-17 DIAGNOSIS — D649 Anemia, unspecified: Secondary | ICD-10-CM

## 2014-06-17 DIAGNOSIS — D638 Anemia in other chronic diseases classified elsewhere: Secondary | ICD-10-CM | POA: Diagnosis not present

## 2014-06-17 LAB — CBC
HCT: 24.8 % — ABNORMAL LOW (ref 39.0–52.0)
Hemoglobin: 7.6 g/dL — ABNORMAL LOW (ref 13.0–17.0)
MCH: 22 pg — ABNORMAL LOW (ref 26.0–34.0)
MCHC: 30.6 g/dL (ref 30.0–36.0)
MCV: 71.7 fL — AB (ref 78.0–100.0)
Platelets: 141 10*3/uL — ABNORMAL LOW (ref 150–400)
RBC: 3.46 MIL/uL — ABNORMAL LOW (ref 4.22–5.81)
RDW: 17.5 % — AB (ref 11.5–15.5)
WBC: 6.4 10*3/uL (ref 4.0–10.5)

## 2014-06-17 LAB — BASIC METABOLIC PANEL
Anion gap: 14 (ref 5–15)
BUN: 44 mg/dL — AB (ref 6–23)
CO2: 20 mEq/L (ref 19–32)
CREATININE: 1.38 mg/dL — AB (ref 0.50–1.35)
Calcium: 8.4 mg/dL (ref 8.4–10.5)
Chloride: 106 mEq/L (ref 96–112)
GFR, EST AFRICAN AMERICAN: 49 mL/min — AB (ref >90)
GFR, EST NON AFRICAN AMERICAN: 42 mL/min — AB (ref >90)
Glucose, Bld: 82 mg/dL (ref 70–99)
POTASSIUM: 4.4 meq/L (ref 3.7–5.3)
Sodium: 140 mEq/L (ref 137–147)

## 2014-06-17 LAB — OCCULT BLOOD, POC DEVICE: Fecal Occult Bld: NEGATIVE

## 2014-06-17 LAB — GLUCOSE, CAPILLARY: Glucose-Capillary: 113 mg/dL — ABNORMAL HIGH (ref 70–99)

## 2014-06-17 MED ORDER — SODIUM CHLORIDE 0.9 % IV BOLUS (SEPSIS)
500.0000 mL | Freq: Once | INTRAVENOUS | Status: AC
  Administered 2014-06-17: 500 mL via INTRAVENOUS

## 2014-06-17 MED ORDER — DILTIAZEM HCL 25 MG/5ML IV SOLN
10.0000 mg | Freq: Once | INTRAVENOUS | Status: AC
  Administered 2014-06-17: 10 mg via INTRAVENOUS
  Filled 2014-06-17 (×3): qty 5

## 2014-06-17 MED ORDER — METOPROLOL TARTRATE 25 MG PO TABS
25.0000 mg | ORAL_TABLET | Freq: Two times a day (BID) | ORAL | Status: DC
  Administered 2014-06-17 – 2014-06-21 (×9): 25 mg via ORAL
  Filled 2014-06-17 (×12): qty 1

## 2014-06-17 MED ORDER — FUROSEMIDE 20 MG PO TABS
20.0000 mg | ORAL_TABLET | Freq: Every day | ORAL | Status: DC
  Administered 2014-06-17 – 2014-06-21 (×5): 20 mg via ORAL
  Filled 2014-06-17 (×5): qty 1

## 2014-06-17 NOTE — Evaluation (Signed)
Clinical/Bedside Swallow Evaluation Patient Details  Name: Mackie PaiGeorge A Baggett MRN: [id number] Date of Birth: 09/06/1917  Today's Date: 06/17/2014 Time: [id number] SLP Time Calculation (min): 14 min  Past Medical History:  Past Medical History  Diagnosis Date  . CAD (coronary artery disease)     a. NSTEMI 2010 felt due to AF-RVR in setting of multivessel CAD, not surgical candidate. b. NSTEMI trop >20, 05/2013 - 3v CAD incl LM - for med rx.  . MI, old   . Mitral valvular regurgitation     a. Mod by echo 10/20114.  . Atrial fibrillation   . Hypertension   . Syncope     a. 05/2013 admission: felt due to NSTEMI vs orthostasis.  Marland Kitchen. NSVT (nonsustained ventricular tachycardia)     a. 05/2013 admission on tele. No sustained arrhythmias.  . Severe protein-calorie malnutrition   . Chronic anemia     a. s/p transfusion 05/2013.  . Orthostatic hypotension     a. During 05/2013 admission (BP 147->84 lying to sitting w/ associated dizziness).  . Paroxysmal a-fib   . COPD (chronic obstructive pulmonary disease)   . Hyperlipidemia   . PAD (peripheral artery disease)    Past Surgical History:  Past Surgical History  Procedure Laterality Date  . Coronary angioplasty with stent placement     HPI:  80 y.o. male with history of CAD, MI, HTN, severe protein calorie malnutrition, COPD admitted with generalized weakness and associated dizziness.  CXR Retrocardiac opacity could represent consolidation and/or effusion.    Assessment / Plan / Recommendation Clinical Impression  Pharyngeal swallow function was within normal limits without indications of penetration or aspiration.  Decreased lingual and buccal cavity residue led to moderate residue requiring verbal/visual and tactile assist to remove.   Regular diet texture recommended despite residue due to common decreased cohesion with egg texture for most pt.'s..Thin liquids recommended and education given to clear all oral residue after meals.  No  follow up needed.  Pt.'s friend (caregiver) present for assessment as well.    Aspiration Risk   (mild-moderate)    Diet Recommendation Regular;Thin liquid   Liquid Administration via: Cup;Straw Medication Administration: Whole meds with liquid Supervision: Patient able to self feed;Intermittent supervision to cue for compensatory strategies Compensations: Small sips/bites;Slow rate;Check for pocketing Postural Changes and/or Swallow Maneuvers: Seated upright 90 degrees    Other  Recommendations Oral Care Recommendations: Oral care BID   Follow Up Recommendations  None    Frequency and Duration        Pertinent Vitals/Pain No evidence pain         Swallow Study       Oral/Motor/Sensory Function Overall Oral Motor/Sensory Function: Appears within functional limits for tasks assessed   Ice Chips Ice chips: Not tested   Thin Liquid Thin Liquid: Within functional limits Presentation: Cup;Straw    Nectar Thick Nectar Thick Liquid: Not tested   Honey Thick Honey Thick Liquid: Not tested   Puree Puree: Not tested   Solid   GO    Solid: Impaired Oral Phase Impairments:  (reduced bucaal cavity strength) Oral Phase Functional Implications: Right lateral sulci pocketing;Oral residue Pharyngeal Phase Impairments:  (none)       Kellsie Grindle, Breck CoonsLisa Willis 06/17/2014,9:08 AM  Breck CoonsLisa Willis Lonell FaceLitaker M.Ed [employer] Pager [phone]

## 2014-06-17 NOTE — Progress Notes (Signed)
Utilization Review Completed.Tex Conroy T10/19/2015  

## 2014-06-17 NOTE — Progress Notes (Signed)
[facility] PROGRESS NOTE  Jeffrey [name] ZOX:[id number] DOB: 10/20/1917 DOA: 06/15/2014 PCP: Leo GrosserPICKARD,WARREN TOM, MD Interim summary: Jeffrey PaiGeorge A [name] is a 80 y.o. male who presents to the ED with c/o generalized weakness. On arrival to ED, he was found to have severe anemia with a hemoglobin around 6, a UTI , paroxysmal afib  And elevated troponin. [facility] consulted.   Assessment/Plan: 1. Elevated troponin with out ACS: Medical management.   Paroxysmal atrial fibrillation: went into RVR today. Increased the dose of metoprolol and his rate is much better now.   Urinary tract infection: On rocephin and urine cultures are pending.   Anemia:  Stool for occult blood negative.  Anemia panel pending. Probably anemia of chronic disease. S/p 2 units of prbc transfusion and repeat H&h is 7.6. Monitor.   An episode of confusion earlier today after he went to the bathroom: Back to baseline. His vital signs have remained stable.    Code Status: full code.  Family Communication: family atbedside Disposition Plan: pending.    Consultants:  cardiology  Procedures:  none  Antibiotics:  none  HPI/Subjective: Denies any complaints.  Objective: Filed Vitals:   06/17/14 1300  BP: 87/53  Pulse: 65  Temp: 98.4 F (36.9 C)  Resp: 18    Intake/Output Summary (Last 24 hours) at 06/17/14 1830 Last data filed at 06/17/14 1421  Gross per 24 hour  Intake    243 ml  Output      0 ml  Net    243 ml   Filed Weights   06/16/14 0400  Weight: 60.9 kg (134 lb 4.2 oz)    Exam:   General:  Alert afebrile ecomfortable  Cardiovascular: s1s2  Respiratory: ctab  Abdomen: soft non tender non distended bowel sounds heard  Musculoskeletal: pedal edema.   Data Reviewed: Basic Metabolic Panel:  Recent Labs Lab 06/15/14 2302 06/17/14 0355  NA 142 140  K 4.0 4.4  CL 106 106  CO2 24 20  GLUCOSE 129* 82  BUN 32* 44*   CREATININE 1.17 1.38*  CALCIUM 8.9 8.4   Liver Function Tests:  Recent Labs Lab 06/15/14 2302  AST 27  ALT 12  ALKPHOS 72  BILITOT 0.3  PROT 7.2  ALBUMIN 3.4*   No results found for this basename: LIPASE, AMYLASE,  in the last 168 hours No results found for this basename: AMMONIA,  in the last 168 hours CBC:  Recent Labs Lab 06/15/14 2302 06/16/14 1100 06/17/14 0355  WBC 8.4 8.2 6.4  NEUTROABS 6.6  --   --   HGB 6.2* 6.9* 7.6*  HCT 20.5* 22.4* 24.8*  MCV 71.9* 70.7* 71.7*  PLT 193 185 141*   Cardiac Enzymes:  Recent Labs Lab 06/15/14 2302 06/16/14 0034  TROPONINI 0.78* 0.81*   BNP (last 3 results)  Recent Labs  10/21/13 1144 06/15/14 2302  PROBNP 4163.0* 12148.0*   CBG:  Recent Labs Lab 06/17/14 1432  GLUCAP 113*    Recent Results (from the past 240 hour(s))  CULTURE, BLOOD (ROUTINE X 2)     Status: None   Collection Time    06/16/14 12:01 AM      Result Value Ref Range Status   Specimen Description Blood BLOOD RIGHT ARM   Final   Special Requests BOTTLES DRAWN AEROBIC AND ANAEROBIC 6CC   Final   Culture NO GROWTH 1 DAY   Final   Report Status PENDING   Incomplete  CULTURE, BLOOD (ROUTINE X 2)     Status: None   Collection Time    06/16/14 12:19 AM      Result Value Ref Range Status   Specimen Description Blood BLOOD RIGHT HAND   Final   Special Requests BOTTLES DRAWN AEROBIC AND ANAEROBIC 6CC   Final   Culture NO GROWTH 1 DAY   Final   Report Status PENDING   Incomplete     Studies: Dg Chest Port 1 View  06/17/2014   CLINICAL DATA:  CHF  EXAM: PORTABLE CHEST - 1 VIEW  COMPARISON:  06/15/2014  FINDINGS: Cardiac shadow remains mildly enlarged. Persistent left retrocardiac density is noted with slight worsening in the interval from the prior exam. Bilateral pleural effusions are noted. No acute bony abnormality is seen.  IMPRESSION: Left basilar opacity with small bilateral pleural effusions.   Electronically Signed   By: Alcide CleverMark  Lukens M.D.    On: 06/17/2014 12:00   Dg Chest Port 1 View  06/15/2014   CLINICAL DATA:  Weakness and cough for 1 day initial evaluation  EXAM: PORTABLE CHEST - 1 VIEW  COMPARISON:  10/22/2013  FINDINGS: Mild cardiac enlargement. Vascular pattern normal. Mild right base atelectasis. Retrocardiac opacity.  IMPRESSION: Retrocardiac opacity could represent consolidation and/or effusion.   Electronically Signed   By: Esperanza Heiraymond  Rubner M.D.   On: 06/15/2014 23:22    Scheduled Meds: . cefTRIAXone (ROCEPHIN)  IV  1 g Intravenous Q24H  . furosemide  20 mg Oral Daily  . heparin  5,000 Units Subcutaneous 3 times per day  . metoprolol tartrate  25 mg Oral BID  . pantoprazole  40 mg Oral Daily  . sodium chloride  500 mL Intravenous Once  . sodium chloride  3 mL Intravenous Q12H   Continuous Infusions:   Principal Problem:   Symptomatic anemia Active Problems:   Anemia   UTI (lower urinary tract infection)   Elevated troponin    Time spent: 25 minutes.     [facility] System[facility]ULA,Ziair Penson  [facility] Pager [phone]. If 7PM-7AM, please contact night-coverage at [email or web address], password [email or web address] 06/17/2014, 6:30 PM  LOS: 2 days

## 2014-06-17 NOTE — Progress Notes (Addendum)
Patient Name: Jeffrey [name] Date of Encounter: 06/17/2014     Principal Problem:   Symptomatic anemia Active Problems:   Anemia   UTI (lower urinary tract infection)   Elevated troponin    SUBJECTIVE  Denies any CP or SOB, feeling well. No palpitation or dizziness  CURRENT MEDS . cefTRIAXone (ROCEPHIN)  IV  1 g Intravenous Q24H  . heparin  5,000 Units Subcutaneous 3 times per day  . Influenza vac split quadrivalent PF  0.5 mL Intramuscular Tomorrow-1000  . metoprolol succinate  25 mg Oral Daily  . pantoprazole  40 mg Oral Daily  . sodium chloride  3 mL Intravenous Q12H    OBJECTIVE  Filed Vitals:   06/16/14 1415 06/16/14 1445 06/16/14 2050 06/17/14 0500  BP: 105/60 118/65 97/62 112/71  Pulse: 77 85 77 81  Temp: 98.6 F (37 C) 98.9 F (37.2 C) 98.2 F (36.8 C) 98.1 F (36.7 C)  TempSrc: Oral Oral Oral Oral  Resp: 18 20 19 19   Height:      Weight:      SpO2: 97% 98% 95% 96%    Intake/Output Summary (Last 24 hours) at 06/17/14 0813 Last data filed at 06/16/14 2123  Gross per 24 hour  Intake   1173 ml  Output      0 ml  Net   1173 ml   Filed Weights   06/16/14 0400  Weight: 134 lb 4.2 oz (60.9 kg)    PHYSICAL EXAM  General: Pleasant, NAD. Neuro: Alert and oriented X 3. Moves all extremities spontaneously. Psych: Normal affect. HEENT:  Normal  Neck: Supple without bruits or JVD. Lungs:  Resp regular and unlabored, CTA. Heart: tachycardic, irregular. no s3, s4, or murmurs. Abdomen: Soft, non-tender, non-distended, BS + x 4.  Extremities: No clubbing, cyanosis or edema. DP/PT/Radials 2+ and equal bilaterally.  Accessory Clinical Findings  CBC  Recent Labs  06/15/14 2302 06/16/14 1100 06/17/14 0355  WBC 8.4 8.2 6.4  NEUTROABS 6.6  --   --   HGB 6.2* 6.9* 7.6*  HCT 20.5* 22.4* 24.8*  MCV 71.9* 70.7* 71.7*  PLT 193 185 141*   Basic Metabolic Panel  Recent Labs  06/15/14 2302 06/17/14 0355  NA 142 140  K 4.0 4.4  CL 106 106    CO2 24 20  GLUCOSE 129* 82  BUN 32* 44*  CREATININE 1.17 1.38*  CALCIUM 8.9 8.4   Liver Function Tests  Recent Labs  06/15/14 2302  AST 27  ALT 12  ALKPHOS 72  BILITOT 0.3  PROT 7.2  ALBUMIN 3.4*   Cardiac Enzymes  Recent Labs  06/15/14 2302 06/16/14 0034  TROPONINI 0.78* 0.81*    TELE NSR with HR 60s overnight, went into a-fib with RVR with HR 120s around 8am    ECG  NSR with RBBB  Echocardiogram 06/06/2013  LV EF: 50%  ------------------------------------------------------------ Indications: Chest pain 786.51.  ------------------------------------------------------------ History: PMH: Syncope. Atrial fibrillation. Coronary artery disease. Mitral regurgitation. Chronic obstructive pulmonary disease. PMH: Myocardial infarction. Risk factors: Current tobacco use. Hypertension. Dyslipidemia.  ------------------------------------------------------------ Study Conclusions  - Left ventricle: Hypokinesis of the inferolateral wall and hypokinesis of the base inferior and mid-inferoseptal. The cavity size was normal. Wall thickness was increased in a pattern of mild LVH. The estimated ejection fraction was 50%. Findings consistent with left ventricular diastolic dysfunction. - Mitral valve: Anterior mitral valve thickening. Probable mild prolapse of anterior leaflet. Posteriorly directed moderate MR. - Left atrium: The atrium was moderately dilated. -  Pulmonary arteries: PA peak pressure: 69mm Hg (S). - Impressions: THE WALL MOTION ABNORMALITIES ARE NEW SINCE 02/2013. THE MR IS OLD. Impressions:  - THE WALL MOTION ABNORMALITIES ARE NEW SINCE 02/2013. THE MR IS OLD.      Radiology/Studies  Dg Chest Port 1 View  06/15/2014   CLINICAL DATA:  Weakness and cough for 1 day initial evaluation  EXAM: PORTABLE CHEST - 1 VIEW  COMPARISON:  10/22/2013  FINDINGS: Mild cardiac enlargement. Vascular pattern normal. Mild right base atelectasis. Retrocardiac  opacity.  IMPRESSION: Retrocardiac opacity could represent consolidation and/or effusion.   Electronically Signed   By: Esperanza Heiraymond  Rubner M.D.   On: 06/15/2014 23:22    ASSESSMENT AND PLAN  80 year old [facility] with complaints of generalized weakness. Found to have profound anemia and UTI. S/p 2 units PRBC and rocephin  1. Troponin elevation without ACS   - per Dr. Bufford ButtnerMehta, consider stopping atorvastatin 40mg , plavix 75mg , ASA 325mg  considering his malnourished state and anemia  - continue toprol and lasix on discharge  2. proxysmal a-fib - restarted around 8am today  - h/o PAF, was in NSR during this admission until 8am today when his HR went from 60-120s, currently in a-fib with RVR. Likely exacerbated by anemia and UTI  - not a candidate for heparin or coumadin given anemia  - unable to uptitrate BB given borderline BP  - consider start IV amiodarone and d/c on PO amiodarone in 24 hrs  3. Anemia: s/p 2 units PRBC 4. Urinary Tract Infection   5. CAD, medical management   - Cath Oct 2014 for NSTEMI, 3V CAD not amenable to PCI (pt not a surgical candidate)  6. Ischemic Cardiomyopathy  7. Moderate Mitral Regurgitation  8. Hospice Care: currently full code   Signed, Amedeo PlentyMeng, Hao PA-C Pager: [phone]   History and all data above reviewed.  Patient examined.  I agree with the findings as above. The patient denies any chest pain.  He did have atrial arrhythmia today.  However, he reports that he did not feel this.  The patient exam reveals COR:  Regular with ectopy  ,  Lungs: Decreased breath sounds  ,  Abd: Positive bowel sounds, no rebound no guarding, Ext No edema   .  All available labs, radiology testing, previous records reviewed. Agree with documented assessment and plan.  Elevated troponin:  Patient has known 3 vessel CAD medical management and came in with a Hgb of 6.2.  Elevated troponin is secondary to this.  No further work up.  If he is not felt to  have active bleeding I would restart Plavix.    PAF:  With anemia he is not a warfarin or NOAC candidate.  No change in therapy.  BP will not allow med titration.    Jeffrey [name]  11:19 AM  06/17/2014

## 2014-06-17 NOTE — Progress Notes (Signed)
1420 patient trying to have a bowel movement on bedside commode with the help of sitter. When he stood up, his eyes were rolled up and getting weak.  Patient was unable to response to call when he was assisted back in bed.  Skin felt mild clammy.  BP checked 105/59,heart rate 76,heart monitor sinus brady.  CBG 113.   1430 patient regained consciousness and back to his baseline.  Wife and granddaughter were at the bedside and witnessed the episode.  Dr Blake DivineAkula notified and she will see patient .will continue to monitor.

## 2014-06-18 DIAGNOSIS — I10 Essential (primary) hypertension: Secondary | ICD-10-CM

## 2014-06-18 LAB — CBC
HCT: 25.1 % — ABNORMAL LOW (ref 39.0–52.0)
Hemoglobin: 7.8 g/dL — ABNORMAL LOW (ref 13.0–17.0)
MCH: 22.9 pg — AB (ref 26.0–34.0)
MCHC: 31.1 g/dL (ref 30.0–36.0)
MCV: 73.8 fL — AB (ref 78.0–100.0)
Platelets: 171 10*3/uL (ref 150–400)
RBC: 3.4 MIL/uL — AB (ref 4.22–5.81)
RDW: 18.2 % — ABNORMAL HIGH (ref 11.5–15.5)
WBC: 5.3 10*3/uL (ref 4.0–10.5)

## 2014-06-18 LAB — RETICULOCYTES
RBC.: 3.4 MIL/uL — ABNORMAL LOW (ref 4.22–5.81)
RETIC CT PCT: 1.4 % (ref 0.4–3.1)
Retic Count, Absolute: 47.6 10*3/uL (ref 19.0–186.0)

## 2014-06-18 LAB — BASIC METABOLIC PANEL
ANION GAP: 15 (ref 5–15)
BUN: 60 mg/dL — ABNORMAL HIGH (ref 6–23)
CALCIUM: 8.4 mg/dL (ref 8.4–10.5)
CHLORIDE: 103 meq/L (ref 96–112)
CO2: 21 mEq/L (ref 19–32)
Creatinine, Ser: 1.75 mg/dL — ABNORMAL HIGH (ref 0.50–1.35)
GFR calc Af Amer: 37 mL/min — ABNORMAL LOW (ref >90)
GFR calc non Af Amer: 32 mL/min — ABNORMAL LOW (ref >90)
GLUCOSE: 122 mg/dL — AB (ref 70–99)
POTASSIUM: 4.3 meq/L (ref 3.7–5.3)
SODIUM: 139 meq/L (ref 137–147)

## 2014-06-18 LAB — URINE CULTURE: Colony Count: >=100000

## 2014-06-18 LAB — IRON AND TIBC: UIBC: 345 ug/dL (ref 125–400)

## 2014-06-18 LAB — FERRITIN: Ferritin: 45 ng/mL (ref 22–322)

## 2014-06-18 LAB — FOLATE: Folate: 16.5 ng/mL

## 2014-06-18 LAB — VITAMIN B12: VITAMIN B 12: 191 pg/mL — AB (ref 211–911)

## 2014-06-18 MED ORDER — POLYETHYLENE GLYCOL 3350 17 G PO PACK
17.0000 g | PACK | Freq: Every day | ORAL | Status: DC
  Administered 2014-06-19: 17 g via ORAL
  Filled 2014-06-18 (×4): qty 1

## 2014-06-18 MED ORDER — DOCUSATE SODIUM 100 MG PO CAPS: 100.0000 mg | ORAL_CAPSULE | Freq: Two times a day (BID) | ORAL | Status: DC | PRN

## 2014-06-18 MED ORDER — SODIUM CHLORIDE 0.9 % IV SOLN
INTRAVENOUS | Status: DC
  Administered 2014-06-18: 13:00:00 via INTRAVENOUS

## 2014-06-18 MED ORDER — FERROUS SULFATE 325 (65 FE) MG PO TABS
325.0000 mg | ORAL_TABLET | Freq: Three times a day (TID) | ORAL | Status: DC
  Administered 2014-06-18 – 2014-06-21 (×9): 325 mg via ORAL
  Filled 2014-06-18 (×12): qty 1

## 2014-06-18 NOTE — Clinical Documentation Improvement (Signed)
"  Acute on CKD stage 2: ? Probably from poor perfusion from hypotension. Gentle hydration and repeat renal parameters in am." documented by Dr. Blake DivineAkula in progress note 06/18/14.   Patient's Serum Creat. Is up 0.37 mg/dL this admission from 25/36/6410/19/15 to 06/18/14.  Please document if a condition below provides greater specificity regarding "Acute on CKD Stage 2":   - Acute Kidney Injury on Stage 2 CKD   - Other Condition   - Unable to Clinically Determine  Thank You, Jerral Ralphathy R Ambera Fedele ,RN Clinical Documentation Specialist:  [phone] [facility]- Health Information Management

## 2014-06-18 NOTE — Progress Notes (Signed)
Patient Name: Jeffrey [name] Date of Encounter: 06/18/2014     Principal Problem:   Symptomatic anemia Active Problems:   Anemia   UTI (lower urinary tract infection)   Elevated troponin    SUBJECTIVE  Mild SOB, denies any CP. Slept well last night.  CURRENT MEDS . cefTRIAXone (ROCEPHIN)  IV  1 g Intravenous Q24H  . furosemide  20 mg Oral Daily  . heparin  5,000 Units Subcutaneous 3 times per day  . metoprolol tartrate  25 mg Oral BID  . pantoprazole  40 mg Oral Daily  . sodium chloride  3 mL Intravenous Q12H    OBJECTIVE  Filed Vitals:   06/17/14 0500 06/17/14 1300 06/17/14 2127 06/18/14 0430  BP: 112/71 87/53 93/60  99/58  Pulse: 81 65 63 71  Temp: 98.1 F (36.7 C) 98.4 F (36.9 C) 97.8 F (36.6 C) 98.2 F (36.8 C)  TempSrc: Oral Oral Oral Oral  Resp: 19 18 18 18   Height:      Weight:    136 lb 6.4 oz (61.871 kg)  SpO2: 96% 95% 96% 97%    Intake/Output Summary (Last 24 hours) at 06/18/14 0810 Last data filed at 06/17/14 1844  Gross per 24 hour  Intake    480 ml  Output      0 ml  Net    480 ml   Filed Weights   06/16/14 0400 06/18/14 0430  Weight: 134 lb 4.2 oz (60.9 kg) 136 lb 6.4 oz (61.871 kg)    PHYSICAL EXAM  General: Pleasant, NAD. Neuro: Alert and oriented X 3. Moves all extremities spontaneously. Psych: Normal affect. HEENT:  Normal  Neck: Supple without bruits or JVD. Lungs:  Resp regular and unlabored, CTA. Heart: RRR no s3, s4, or murmurs. Abdomen: Soft, non-tender, non-distended, BS + x 4.  Extremities: No clubbing, cyanosis or edema. DP/PT/Radials 2+ and equal bilaterally.  Accessory Clinical Findings  CBC  Recent Labs  06/15/14 2302  06/17/14 0355 06/18/14 0412  WBC 8.4  < > 6.4 5.3  NEUTROABS 6.6  --   --   --   HGB 6.2*  < > 7.6* 7.8*  HCT 20.5*  < > 24.8* 25.1*  MCV 71.9*  < > 71.7* 73.8*  PLT 193  < > 141* 171  < > = values in this interval not displayed. Basic Metabolic Panel  Recent Labs  06/17/14 0355  06/18/14 0412  NA 140 139  K 4.4 4.3  CL 106 103  CO2 20 21  GLUCOSE 82 122*  BUN 44* 60*  CREATININE 1.38* 1.75*  CALCIUM 8.4 8.4   Liver Function Tests  Recent Labs  06/15/14 2302  AST 27  ALT 12  ALKPHOS 72  BILITOT 0.3  PROT 7.2  ALBUMIN 3.4*   Cardiac Enzymes  Recent Labs  06/15/14 2302 06/16/14 0034  TROPONINI 0.78* 0.81*    TELE NSR with HR 60s, some missed beats, no significant ventricular ectopy    ECG  EKG 10/19 a-fib with RVR. No new EKG  Echocardiogram 06/06/2013  LV EF: 50%  ------------------------------------------------------------ Indications: Chest pain 786.51.  ------------------------------------------------------------ History: PMH: Syncope. Atrial fibrillation. Coronary artery disease. Mitral regurgitation. Chronic obstructive pulmonary disease. PMH: Myocardial infarction. Risk factors: Current tobacco use. Hypertension. Dyslipidemia.  ------------------------------------------------------------ Study Conclusions  - Left ventricle: Hypokinesis of the inferolateral wall and hypokinesis of the base inferior and mid-inferoseptal. The cavity size was normal. Wall thickness was increased in a pattern of mild LVH. The estimated ejection fraction  was 50%. Findings consistent with left ventricular diastolic dysfunction. - Mitral valve: Anterior mitral valve thickening. Probable mild prolapse of anterior leaflet. Posteriorly directed moderate MR. - Left atrium: The atrium was moderately dilated. - Pulmonary arteries: PA peak pressure: 69mm Hg (S). - Impressions: THE WALL MOTION ABNORMALITIES ARE NEW SINCE 02/2013. THE MR IS OLD. Impressions:  - THE WALL MOTION ABNORMALITIES ARE NEW SINCE 02/2013. THE MR IS OLD.      Radiology/Studies  Dg Chest Port 1 View  06/17/2014   CLINICAL DATA:  CHF  EXAM: PORTABLE CHEST - 1 VIEW  COMPARISON:  06/15/2014  FINDINGS: Cardiac shadow remains mildly enlarged. Persistent left retrocardiac  density is noted with slight worsening in the interval from the prior exam. Bilateral pleural effusions are noted. No acute bony abnormality is seen.  IMPRESSION: Left basilar opacity with small bilateral pleural effusions.   Electronically Signed   By: Alcide Clever M.D.   On: 06/17/2014 12:00   Dg Chest Port 1 View  06/15/2014   CLINICAL DATA:  Weakness and cough for 1 day initial evaluation  EXAM: PORTABLE CHEST - 1 VIEW  COMPARISON:  10/22/2013  FINDINGS: Mild cardiac enlargement. Vascular pattern normal. Mild right base atelectasis. Retrocardiac opacity.  IMPRESSION: Retrocardiac opacity could represent consolidation and/or effusion.   Electronically Signed   By: Esperanza Heir M.D.   On: 06/15/2014 23:22    ASSESSMENT AND PLAN  80 year old male presented to [facility] emergency room with complaints of generalized weakness. Found to have profound anemia and UTI. S/p 2 units PRBC and rocephin   1. Troponin elevation without ACS   - in the setting of severe anemia and known 3 vessel disease not amenable to PCI or surgery. On hospice  - per Dr. Bufford Buttner, consider stopping atorvastatin 40mg , plavix 75mg , ASA 325mg  considering his malnourished state and anemia   - per Dr. Antoine Poche, if no obvious sign of bleeding, will restart plavix on discharge.   2. proxysmal a-fib - in NSR  - brief run of a-fib yesterday morning, resolved after a dose of cardizem  - not a candidate for heparin or coumadin given anemia   - unable to uptitrate BB given borderline BP   3. Acute renal insufficiency  - worsening Cr, maybe related to combination of hypotension and UTI. If continue to trend up, may need to cut back on BB and consider low dose antiarrythmic medication. Will try to avoid significant change in medication for now.  4. Anemia: s/p 2 units PRBC   - hgb stable this morning  5. Urinary Tract Infection  6. CAD, medical management   - Cath Oct 2014 for NSTEMI, 3V CAD not amenable to PCI (pt not a  surgical candidate)   7. Ischemic Cardiomyopathy  8. Moderate Mitral Regurgitation  9. Hospice Care: currently full code   Signed, Amedeo Plenty Pager: [phone]  History and all data above reviewed.  Patient examined.  I agree with the findings as above.  He says that he feels good.  Telemetry was reviewed by me today and he has had no further atrial fib.    The patient exam reveals COR:RRR, murmur unchanged  ,  Lungs: Clear  ,  Abd: Positive bowel sounds, no rebound no guarding, Ext No edema   .  All available labs, radiology testing, previous records reviewed. Agree with documented assessment and plan. No further work up of elevated troponin.  Start Plavix (per primary team) if they think he is low risk for  screening (stool guaiac is negative).  Please call with further questions.   Fayrene FearingJames Jacqueline Delapena  11:49 AM  06/18/2014

## 2014-06-18 NOTE — Progress Notes (Signed)
[facility] PROGRESS NOTE  Mackie PaiGeorge A Cashatt BJY:[id number] DOB: 02/06/1918 DOA: 06/15/2014 PCP: Leo GrosserPICKARD,WARREN TOM, MD Interim summary: Mackie PaiGeorge A Stamour is a [age] y.o. male who presents to the ED with c/o generalized weakness. On arrival to ED, he was found to have severe anemia with a hemoglobin around 6, a UTI , paroxysmal afib  And elevated troponin. [facility] and cardiology consulted.   Assessment/Plan: 1. Elevated troponin with out ACS: Medical management.   Paroxysmal atrial fibrillation: went into RVR today. Increased the dose of metoprolol , received a dose of cardizem, and his rate is much better now.   Urinary tract infection: On rocephin and urine cultures are pending.   Anemia:  Stool for occult blood negative.  Anemia panel show low iron levels and we have added on iron supplements with stool softeners.  Probably anemia of chronic disease. S/p 2 units of prbc transfusion and repeat H&h is 7.8. Monitor.   An episode of confusion earlier today after he went to the bathroom: Back to baseline. His vital signs have remained stable.   Acute on CKD stage 2: ? Probably from poor perfusion from hypotension. Gentle hydration and repeat renal parameters in am.   DVT prophylaxis.      Code Status: full code.  Family Communication: family atbedside Disposition Plan: pending PT evaluation.    Consultants:  cardiology  Procedures:  none  Antibiotics:  none  HPI/Subjective: Denies any complaints. No sob or chest pain. No there episodes of confusion.  Objective: Filed Vitals:   06/18/14 1041  BP: 108/67  Pulse: 68  Temp:   Resp:     Intake/Output Summary (Last 24 hours) at 06/18/14 1241 Last data filed at 06/18/14 0900  Gross per 24 hour  Intake    720 ml  Output      0 ml  Net    720 ml   Filed Weights   06/16/14 0400 06/18/14 0430  Weight: 60.9 kg (134 lb 4.2 oz) 61.871 kg (136 lb 6.4 oz)     Exam:   General:  Alert afebrile ecomfortable  Cardiovascular: s1s2  Respiratory: ctab  Abdomen: soft non tender non distended bowel sounds heard  Musculoskeletal: pedal edema.   Data Reviewed: Basic Metabolic Panel:  Recent Labs Lab 06/15/14 2302 06/17/14 0355 06/18/14 0412  NA 142 140 139  K 4.0 4.4 4.3  CL 106 106 103  CO2 24 20 21   GLUCOSE 129* 82 122*  BUN 32* 44* 60*  CREATININE 1.17 1.38* 1.75*  CALCIUM 8.9 8.4 8.4   Liver Function Tests:  Recent Labs Lab 06/15/14 2302  AST 27  ALT 12  ALKPHOS 72  BILITOT 0.3  PROT 7.2  ALBUMIN 3.4*   No results found for this basename: LIPASE, AMYLASE,  in the last 168 hours No results found for this basename: AMMONIA,  in the last 168 hours CBC:  Recent Labs Lab 06/15/14 2302 06/16/14 1100 06/17/14 0355 06/18/14 0412  WBC 8.4 8.2 6.4 5.3  NEUTROABS 6.6  --   --   --   HGB 6.2* 6.9* 7.6* 7.8*  HCT 20.5* 22.4* 24.8* 25.1*  MCV 71.9* 70.7* 71.7* 73.8*  PLT 193 185 141* 171   Cardiac Enzymes:  Recent Labs Lab 06/15/14 2302 06/16/14 0034  TROPONINI 0.78* 0.81*   BNP (last 3 results)  Recent Labs  10/21/13 1144 06/15/14 2302  PROBNP 4163.0* 12148.0*   CBG:  Recent Labs Lab 06/17/14 1432  GLUCAP 113*  Recent Results (from the past 240 hour(s))  URINE CULTURE     Status: None   Collection Time    06/15/14 11:33 PM      Result Value Ref Range Status   Specimen Description URINE, CATHETERIZED   Final   Special Requests NONE   Final   Culture  Setup Time     Final   Value: 06/17/2014 00:16     Performed at [facility]   Colony Count PENDING   Incomplete   Culture     Final   Value: Culture reincubated for better growth     [facility]formed at [facility]   Report Status PENDING   Incomplete  CULTURE, BLOOD (ROUTINE X 2)     Status: None   Collection Time    06/16/14 12:01 AM      Result Value Ref Range Status   Specimen Description BLOOD RIGHT ARM   Final   Special  Requests BOTTLES DRAWN AEROBIC AND ANAEROBIC 6CC   Final   Culture NO GROWTH 2 DAYS   Final   Report Status PENDING   Incomplete  CULTURE, BLOOD (ROUTINE X 2)     Status: None   Collection Time    06/16/14 12:19 AM      Result Value Ref Range Status   Specimen Description BLOOD RIGHT HAND   Final   Special Requests BOTTLES DRAWN AEROBIC AND ANAEROBIC 6CC   Final   Culture NO GROWTH 2 DAYS   Final   Report Status PENDING   Incomplete     Studies: Dg Chest Port 1 View  06/17/2014   CLINICAL DATA:  CHF  EXAM: PORTABLE CHEST - 1 VIEW  COMPARISON:  06/15/2014  FINDINGS: Cardiac shadow remains mildly enlarged. Persistent left retrocardiac density is noted with slight worsening in the interval from the prior exam. Bilateral pleural effusions are noted. No acute bony abnormality is seen.  IMPRESSION: Left basilar opacity with small bilateral pleural effusions.   Electronically Signed   By: Alcide CleverMark  Lukens M.D.   On: 06/17/2014 12:00    Scheduled Meds: . cefTRIAXone (ROCEPHIN)  IV  1 g Intravenous Q24H  . furosemide  20 mg Oral Daily  . heparin  5,000 Units Subcutaneous 3 times per day  . metoprolol tartrate  25 mg Oral BID  . pantoprazole  40 mg Oral Daily  . sodium chloride  3 mL Intravenous Q12H   Continuous Infusions: . sodium chloride      Principal Problem:   Symptomatic anemia Active Problems:   Anemia   UTI (lower urinary tract infection)   Elevated troponin    Time spent: 25 minutes.     [facility] HospitalKULA,Maddyx Vallie  [facility] Pager [phone]. If 7PM-7AM, please contact night-coverage at [email or web address], password [email or web address] 06/18/2014, 12:41 PM  LOS: 3 days

## 2014-06-19 MED ORDER — CLOPIDOGREL BISULFATE 75 MG PO TABS
75.0000 mg | ORAL_TABLET | Freq: Every day | ORAL | Status: DC
  Administered 2014-06-19 – 2014-06-21 (×3): 75 mg via ORAL
  Filled 2014-06-19 (×3): qty 1

## 2014-06-19 NOTE — Progress Notes (Signed)
OT Cancellation Note  Patient Details Name: Jeffrey [name] MRN: [id number] DOB: 05/07/1918   Cancelled Treatment:    Reason Eval/Treat Not Completed: Patient at procedure or test/ unavailable - Attempted second time, and pt with PT.  Will continue attempts.   Angelene GiovanniConarpe, Ezrah Panning M Florita Nitsch [place], OTR/L [phone]  06/19/2014, 12:04 PM

## 2014-06-19 NOTE — Care Management Note (Signed)
    Page 1 of 1   06/21/2014     3:57:19 PM CARE MANAGEMENT NOTE 06/21/2014  Patient:  Jeffrey [name],Jeffrey [name]   Account Number:  [id number]  Date Initiated:  06/19/2014  Documentation initiated by:  Glena Pharris  Subjective/Objective Assessment:   Pt adm on 10/17 with weakness, anemia, UTI.  PTA, pt resides at home with girlfriend.     Action/Plan:   PT recommending SNF at dc.  CSW consulted to facilitate dc to SNF when medically stable for dc.   Anticipated DC Date:  06/21/2014   Anticipated DC Plan:  SKILLED NURSING FACILITY  In-house referral  Clinical Social Worker      DC Planning Services  CM consult      Choice offered to / List presented to:             Status of service:  Completed, signed off Medicare Important Message given?  YES (If response is "NO", the following Medicare IM given date fields will be blank) Date Medicare IM given:  06/19/2014 Medicare IM given by:  Analysse Quinonez Date Additional Medicare IM given:   Additional Medicare IM given by:    Discharge Disposition:  SKILLED NURSING FACILITY  Per UR Regulation:  Reviewed for med. necessity/level of care/duration of stay  If discussed at Long Length of Stay Meetings, dates discussed:    Comments:  06/21/14 Sidney AceJulie Kerron Sedano, RN, BSN [phone] Pt discharging to SNF today, per CSW arrangements.  06/19/14 Sidney AceJulie Onetha Gaffey, RN, BSN [phone] Per report, pt is active with Hospice.  [place]...contacted [facility] and [facility], and neither agency have [name] record of this pt.  Will follow up with family to see if they can identify which Hospice agency pt is active with.

## 2014-06-19 NOTE — Evaluation (Signed)
Physical Therapy Evaluation Patient Details Name: Jeffrey [name] MRN: [id number] DOB: 07/29/1918 Today's Date: 06/19/2014   History of Present Illness  Jeffrey [name] is a 80 y.o. male who presents to the ED with c/o generalized weakness. On arrival to ED, he was found to have severe anemia with a hemoglobin around 6, a UTI , paroxysmal afib  And elevated troponin  Clinical Impression  Pt admitted with the above. Pt currently with functional limitations due to the deficits listed below (see PT Problem List). Per friend's report who was present with pt during evaluation, he was ambulating without an assistive device several days ago prior to becoming ill. He is currently at a max assist level for mobility due to weakness and poor cognition. Feel patient would benefit from additional care and rehabilitation at SNF to improve pts functional independence prior to returning home. Pt will benefit from skilled PT to increase their independence and safety with mobility to allow discharge to the venue listed below.       Follow Up Recommendations SNF    Equipment Recommendations  None recommended by PT    Recommendations for Other Services       Precautions / Restrictions Precautions Precautions: Fall Restrictions Weight Bearing Restrictions: No      Mobility  Bed Mobility Overal bed mobility: Needs Assistance Bed Mobility: Supine to Sit;Rolling;Sit to Supine Rolling: Max assist   Supine to sit: Max assist;+2 for physical assistance;HOB elevated Sit to supine: Max assist   General bed mobility comments: Max assist for rolling to pt left and right while repositioning bed pad. Max vc and assist +2 for supine to sit for LE support and trunk control. Pt requires contact at all times for upright posture at edge of bed. Pt able to bring one leg back into bed for sit>supine transfer but required max assist for other LE and trunk.  Transfers                 General transfer  comment: Unable to tolerate transfer at this time due to weakness  Ambulation/Gait                Stairs            Wheelchair Mobility    Modified Rankin (Stroke Patients Only)       Balance Overall balance assessment: Needs assistance Sitting-balance support: Bilateral upper extremity supported;Feet supported Sitting balance-Leahy Scale: Poor   Postural control: Posterior lean                                   Pertinent Vitals/Pain Pain Assessment: No/denies pain    Home Living Family/patient expects to be discharged to:: Unsure Living Arrangements: Alone Available Help at Discharge: Friend(s);Available 24 hours/day;Home health;Other (Comment) (Hospice care) Type of Home: Apartment Home Access: Level entry     Home Layout: One level Home Equipment: Walker - 2 wheels;Cane - single point;Shower seat;Wheelchair - manual;Hospital bed      Prior Function Level of Independence: Needs assistance   Gait / Transfers Assistance Needed: uses w/c for community distances. Reports no device use in house  ADL's / Homemaking Assistance Needed: Aide helps with bath during week        Hand Dominance   Dominant Hand: Right    Extremity/Trunk Assessment   Upper Extremity Assessment: Defer to OT evaluation           Lower Extremity  Assessment: Generalized weakness         Communication   Communication: No difficulties  Cognition Arousal/Alertness: Lethargic Behavior During Therapy: Flat affect Overall Cognitive Status: Impaired/Different from baseline Area of Impairment: Orientation;Attention;Memory;Following commands;Problem solving Orientation Level: Disoriented to;Place;Time;Situation Current Attention Level: Sustained Memory: Decreased short-term memory Following Commands: Follows one step commands inconsistently;Follows one step commands with increased time     Problem Solving: Slow processing;Decreased initiation;Difficulty  sequencing;Requires tactile cues;Requires verbal cues      General Comments      Exercises Other Exercises Other Exercises: Sitting balance activites while sitting EOB for truncal activation. Pt with poor respone to reaching and following commands accurately. VC for anterior weightshift and responds intermittently. Tolerated sitting edge of bed approximately 5 minutes - SpO2 dropped to 88% from 93% with HR of 76 BPM.      Assessment/Plan    PT Assessment Patient needs continued PT services  PT Diagnosis Generalized weakness;Altered mental status   PT Problem List Decreased strength;Decreased range of motion;Decreased activity tolerance;Decreased balance;Decreased mobility;Decreased knowledge of use of DME;Decreased cognition;Cardiopulmonary status limiting activity  PT Treatment Interventions DME instruction;Gait training;Functional mobility training;Therapeutic activities;Therapeutic exercise;Balance training;Neuromuscular re-education;Cognitive remediation;Patient/family education;Wheelchair mobility training   PT Goals (Current goals can be found in the Care Plan section) Acute Rehab PT Goals Patient Stated Goal: None stated PT Goal Formulation: Patient unable to participate in goal setting Time For Goal Achievement: 06/26/14 Potential to Achieve Goals: Fair    Frequency Min 2X/week   Barriers to discharge        Co-evaluation               End of Session   Activity Tolerance: Patient limited by fatigue;Patient limited by lethargy Patient left: in bed;with call bell/phone within reach;with family/visitor present;with nursing/sitter in room Nurse Communication: Other (comment) (Student nurse assisted with bed mobility)         Time: [id number] PT Time Calculation (min): 34 min   Charges:   PT Evaluation $Initial PT Evaluation Tier I: 1 Procedure PT Treatments $Therapeutic Activity: 23-37 mins   PT G Codes:        Charlsie MerlesLogan Secor Ally Knodel,  [place]PT [phone]   [name], [name] 06/19/2014, 1:43 PM

## 2014-06-19 NOTE — Progress Notes (Signed)
[facility] PROGRESS NOTE  Jeffrey PaiGeorge A Arch [id number] DOB: 10/20/1917 DOA: 06/15/2014 PCP: Leo GrosserPICKARD,WARREN TOM, MD Interim summary: Jeffrey [name] is a 80 y.o. male who presents to the ED with c/o generalized weakness. On arrival to ED, he was found to have severe anemia with a hemoglobin around 6, a UTI , paroxysmal afib  And elevated troponin. [facility] and cardiology consulted.   Assessment/Plan: Elevated troponin without ACS: Medical management.  -plavix per cards  Paroxysmal atrial fibrillation:  - Increased the dose of metoprolol  -received a dose of cardizem-  rate is much better now.   Urinary tract infection: Multi-growth; d/c abx  Anemia:  Stool for occult blood negative.  Anemia panel show low iron levels and we have added on iron supplements with stool softeners.  Probably anemia of chronic disease vs another underlying etiology S/p 2 units of prbc transfusion  -monitor for bleeding as BUN increasing  CKD III monitor   Was on hospice at home Poor overall prognosis    Code Status: full code.  Family Communication: friend at bedside Disposition Plan: pending PT evaluation.    Consultants:  cardiology  Procedures:  none    HPI/Subjective: C/o being tired  Objective: Filed Vitals:   06/19/14 0510  BP: 114/66  Pulse: 64  Temp: 98.1 F (36.7 C)  Resp: 18    Intake/Output Summary (Last 24 hours) at 06/19/14 1054 Last data filed at 06/19/14 1046  Gross per 24 hour  Intake 238.33 ml  Output      0 ml  Net 238.33 ml   Filed Weights   06/16/14 0400 06/18/14 0430  Weight: 60.9 kg (134 lb 4.2 oz) 61.871 kg (136 lb 6.4 oz)    Exam:   General:  Elderly pale  Cardiovascular: s1s2  Respiratory: ctab  Abdomen: soft non tender non distended bowel sounds heard  Musculoskeletal: pedal edema.   Data Reviewed: Basic Metabolic Panel:  Recent Labs Lab 06/15/14 2302 06/17/14 0355 06/18/14 0412   NA 142 140 139  K 4.0 4.4 4.3  CL 106 106 103  CO2 24 20 21   GLUCOSE 129* 82 122*  BUN 32* 44* 60*  CREATININE 1.17 1.38* 1.75*  CALCIUM 8.9 8.4 8.4   Liver Function Tests:  Recent Labs Lab 06/15/14 2302  AST 27  ALT 12  ALKPHOS 72  BILITOT 0.3  PROT 7.2  ALBUMIN 3.4*   No results found for this basename: LIPASE, AMYLASE,  in the last 168 hours No results found for this basename: AMMONIA,  in the last 168 hours CBC:  Recent Labs Lab 06/15/14 2302 06/16/14 1100 06/17/14 0355 06/18/14 0412  WBC 8.4 8.2 6.4 5.3  NEUTROABS 6.6  --   --   --   HGB 6.2* 6.9* 7.6* 7.8*  HCT 20.5* 22.4* 24.8* 25.1*  MCV 71.9* 70.7* 71.7* 73.8*  PLT 193 185 141* 171   Cardiac Enzymes:  Recent Labs Lab 06/15/14 2302 06/16/14 0034  TROPONINI 0.78* 0.81*   BNP (last 3 results)  Recent Labs  10/21/13 1144 06/15/14 2302  PROBNP 4163.0* 12148.0*   CBG:  Recent Labs Lab 06/17/14 1432  GLUCAP 113*    Recent Results (from the past 240 hour(s))  URINE CULTURE     Status: None   Collection Time    06/15/14 11:33 PM      Result Value Ref Range Status   Specimen Description URINE, CATHETERIZED   Final   Special Requests NONE  Final   Culture  Setup Time     Final   Value: 06/17/2014 00:16     Performed at [facility]   Colony Count     Final   Value: >=100,000 COLONIES/ML     Performed at [facility]   Culture     Final   Value: Multiple bacterial morphotypes present, none predominant. Suggest appropriate recollection if clinically indicated.     Performed at [facility]   Report Status 06/18/2014 FINAL   Final  CULTURE, BLOOD (ROUTINE X 2)     Status: None   Collection Time    06/16/14 12:01 AM      Result Value Ref Range Status   Specimen Description BLOOD RIGHT ARM   Final   Special Requests BOTTLES DRAWN AEROBIC AND ANAEROBIC 6CC   Final   Culture NO GROWTH 3 DAYS   Final   Report Status PENDING   Incomplete  CULTURE, BLOOD (ROUTINE X  2)     Status: None   Collection Time    06/16/14 12:19 AM      Result Value Ref Range Status   Specimen Description BLOOD RIGHT HAND   Final   Special Requests BOTTLES DRAWN AEROBIC AND ANAEROBIC 6CC   Final   Culture NO GROWTH 3 DAYS   Final   Report Status PENDING   Incomplete     Studies: Dg Chest Port 1 View  06/17/2014   CLINICAL DATA:  CHF  EXAM: PORTABLE CHEST - 1 VIEW  COMPARISON:  06/15/2014  FINDINGS: Cardiac shadow remains mildly enlarged. Persistent left retrocardiac density is noted with slight worsening in the interval from the prior exam. Bilateral pleural effusions are noted. No acute bony abnormality is seen.  IMPRESSION: Left basilar opacity with small bilateral pleural effusions.   Electronically Signed   By: Alcide CleverMark  Lukens M.D.   On: 06/17/2014 12:00    Scheduled Meds: . clopidogrel  75 mg Oral Daily  . ferrous sulfate  325 mg Oral TID WC  . furosemide  20 mg Oral Daily  . heparin  5,000 Units Subcutaneous 3 times per day  . metoprolol tartrate  25 mg Oral BID  . pantoprazole  40 mg Oral Daily  . polyethylene glycol  17 g Oral Daily  . sodium chloride  3 mL Intravenous Q12H   Continuous Infusions: . sodium chloride 50 mL/hr at 06/18/14 1252    Principal Problem:   Symptomatic anemia Active Problems:   Anemia   UTI (lower urinary tract infection)   Elevated troponin    Time spent: 25 minutes.     Marlin CanaryVANN, Melony Tenpas  [facility] Pager [phone]. If 7PM-7AM, please contact night-coverage at [email or web address], password [email or web address] 06/19/2014, 10:54 AM  LOS: 4 days

## 2014-06-19 NOTE — Progress Notes (Signed)
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  Angelene GiovanniConarpe, Liyanna Cartwright M Arshad Oberholzer Dickinsononarpe, OTR/L [phone]  06/19/2014, 11:20 AM

## 2014-06-20 LAB — MAGNESIUM: MAGNESIUM: 2.4 mg/dL (ref 1.5–2.5)

## 2014-06-20 LAB — TYPE AND SCREEN
ABO/RH(D): O POS
Antibody Screen: POSITIVE
DAT, IgG: NEGATIVE
DONOR AG TYPE: NEGATIVE
Donor AG Type: NEGATIVE
UNIT DIVISION: 0
Unit division: 0
Unit division: 0

## 2014-06-20 LAB — CBC
HEMATOCRIT: 24.4 % — AB (ref 39.0–52.0)
Hemoglobin: 7.4 g/dL — ABNORMAL LOW (ref 13.0–17.0)
MCH: 22.7 pg — ABNORMAL LOW (ref 26.0–34.0)
MCHC: 30.3 g/dL (ref 30.0–36.0)
MCV: 74.8 fL — AB (ref 78.0–100.0)
Platelets: 177 10*3/uL (ref 150–400)
RBC: 3.26 MIL/uL — ABNORMAL LOW (ref 4.22–5.81)
RDW: 19.1 % — ABNORMAL HIGH (ref 11.5–15.5)
WBC: 4.9 10*3/uL (ref 4.0–10.5)

## 2014-06-20 LAB — GLUCOSE, CAPILLARY: GLUCOSE-CAPILLARY: 121 mg/dL — AB (ref 70–99)

## 2014-06-20 LAB — BASIC METABOLIC PANEL
Anion gap: 12 (ref 5–15)
BUN: 42 mg/dL — AB (ref 6–23)
CHLORIDE: 109 meq/L (ref 96–112)
CO2: 20 mEq/L (ref 19–32)
CREATININE: 1.24 mg/dL (ref 0.50–1.35)
Calcium: 8.3 mg/dL — ABNORMAL LOW (ref 8.4–10.5)
GFR calc Af Amer: 56 mL/min — ABNORMAL LOW (ref >90)
GFR calc non Af Amer: 48 mL/min — ABNORMAL LOW (ref >90)
Glucose, Bld: 103 mg/dL — ABNORMAL HIGH (ref 70–99)
Potassium: 4.3 mEq/L (ref 3.7–5.3)
Sodium: 141 mEq/L (ref 137–147)

## 2014-06-20 LAB — PREPARE RBC (CROSSMATCH)

## 2014-06-20 MED ORDER — SODIUM CHLORIDE 0.9 % IV SOLN
Freq: Once | INTRAVENOUS | Status: AC
  Administered 2014-06-20: 10:00:00 via INTRAVENOUS

## 2014-06-20 MED ORDER — FUROSEMIDE 10 MG/ML IJ SOLN
20.0000 mg | Freq: Once | INTRAMUSCULAR | Status: AC
  Administered 2014-06-20: 20 mg via INTRAVENOUS
  Filled 2014-06-20: qty 2

## 2014-06-20 NOTE — Progress Notes (Signed)
[facility] PROGRESS NOTE  Jeffrey [name] Wierenga WUJ:[id number] DOB: 09/01/1917 DOA: 06/15/2014 PCP: Leo GrosserPICKARD,Jeffrey TOM, MD Interim summary: Jeffrey [name] Jeffrey [name] is [name] 80 y.o. male who presents to the ED with c/o generalized weakness. On arrival to ED, he was found to have severe anemia with [name] hemoglobin around 6, [name] UTI , paroxysmal afib  And elevated troponin. [facility].   Assessment/Plan: Elevated troponin without ACS: Medical management.   Paroxysmal atrial fibrillation:  - Increased the dose of metoprolol  -received [name] dose of cardizem-  rate is much better now.   Urinary tract infection: Multi-growth; d/c abx  Anemia:  Stool for occult blood negative.  Anemia panel show low iron levels and we have added on iron supplements with stool softeners.  Probably anemia of chronic disease vs another underlying etiology S/p 2 units of prbc transfusion - will give 1 more- lasix after - anemia has been issue in the past and workup has not been pursued to to patient's age/condition  CKD III monitor   Was on hospice at home?? Unable to confirm- does need palliative care consult- daughter to come around noon- will discuss with her Poor overall prognosis    Code Status: full code.  Family Communication: friend at bedside Disposition Plan: SNF vs home with family   Consultants:  cardiology  Procedures:  none    HPI/Subjective: Poor eye contact No new c/o today  Objective: Filed Vitals:   06/20/14 0412  BP: 137/77  Pulse: 72  Temp: 98.3 F (36.8 C)  Resp: 17    Intake/Output Summary (Last 24 hours) at 06/20/14 1023 Last data filed at 06/19/14 1600  Gross per 24 hour  Intake    690 ml  Output      0 ml  Net    690 ml   Filed Weights   06/16/14 0400 06/18/14 0430  Weight: 60.9 kg (134 lb 4.2 oz) 61.871 kg (136 lb 6.4 oz)    Exam:   General:  Elderly pale, minimal words  Cardiovascular:  s1s2  Respiratory: ctab  Abdomen: soft non tender non distended bowel sounds heard  Musculoskeletal: pedal edema.   Data Reviewed: Basic Metabolic Panel:  Recent Labs Lab 06/15/14 2302 06/17/14 0355 06/18/14 0412 06/20/14 0441  NA 142 140 139 141  K 4.0 4.4 4.3 4.3  CL 106 106 103 109  CO2 24 20 21 20   GLUCOSE 129* 82 122* 103*  BUN 32* 44* 60* 42*  CREATININE 1.17 1.38* 1.75* 1.24  CALCIUM 8.9 8.4 8.4 8.3*   Liver Function Tests:  Recent Labs Lab 06/15/14 2302  AST 27  ALT 12  ALKPHOS 72  BILITOT 0.3  PROT 7.2  ALBUMIN 3.4*   No results found for this basename: LIPASE, AMYLASE,  in the last 168 hours No results found for this basename: AMMONIA,  in the last 168 hours CBC:  Recent Labs Lab 06/15/14 2302 06/16/14 1100 06/17/14 0355 06/18/14 0412 06/20/14 0441  WBC 8.4 8.2 6.4 5.3 4.9  NEUTROABS 6.6  --   --   --   --   HGB 6.2* 6.9* 7.6* 7.8* 7.4*  HCT 20.5* 22.4* 24.8* 25.1* 24.4*  MCV 71.9* 70.7* 71.7* 73.8* 74.8*  PLT 193 185 141* 171 177   Cardiac Enzymes:  Recent Labs Lab 06/15/14 2302 06/16/14 0034  TROPONINI 0.78* 0.81*   BNP (last 3 results)  Recent Labs  10/21/13 1144 06/15/14 2302  PROBNP 4163.0* 12148.0*  CBG:  Recent Labs Lab 06/17/14 1432  GLUCAP 113*    Recent Results (from the past 240 hour(s))  URINE CULTURE     Status: None   Collection Time    06/15/14 11:33 PM      Result Value Ref Range Status   Specimen Description URINE, CATHETERIZED   Final   Special Requests NONE   Final   Culture  Setup Time     Final   Value: 06/17/2014 00:16     Performed at [facility]   Colony Count     Final   Value: >=100,000 COLONIES/ML     Performed at [facility]   Culture     Final   Value: Multiple bacterial morphotypes present, none predominant. Suggest appropriate recollection if clinically indicated.     Performed at [facility]   Report Status 06/18/2014 FINAL   Final  CULTURE, BLOOD  (ROUTINE X 2)     Status: None   Collection Time    06/16/14 12:01 AM      Result Value Ref Range Status   Specimen Description BLOOD RIGHT ARM   Final   Special Requests BOTTLES DRAWN AEROBIC AND ANAEROBIC 6CC   Final   Culture NO GROWTH 3 DAYS   Final   Report Status PENDING   Incomplete  CULTURE, BLOOD (ROUTINE X 2)     Status: None   Collection Time    06/16/14 12:19 AM      Result Value Ref Range Status   Specimen Description BLOOD RIGHT HAND   Final   Special Requests BOTTLES DRAWN AEROBIC AND ANAEROBIC 6CC   Final   Culture NO GROWTH 3 DAYS   Final   Report Status PENDING   Incomplete     Studies: No results found.  Scheduled Meds: . sodium chloride   Intravenous Once  . clopidogrel  75 mg Oral Daily  . ferrous sulfate  325 mg Oral TID WC  . furosemide  20 mg Intravenous Once  . furosemide  20 mg Oral Daily  . heparin  5,000 Units Subcutaneous 3 times per day  . metoprolol tartrate  25 mg Oral BID  . pantoprazole  40 mg Oral Daily  . polyethylene glycol  17 g Oral Daily  . sodium chloride  3 mL Intravenous Q12H   Continuous Infusions:    Principal Problem:   Symptomatic anemia Active Problems:   Anemia   UTI (lower urinary tract infection)   Elevated troponin    Time spent: 25 minutes.     Marlin CanaryVANN, Shree Espey  [facility] Pager [phone]. If 7PM-7AM, please contact night-coverage at [email or web address], password [email or web address] 06/20/2014, 10:23 AM  LOS: 5 days

## 2014-06-20 NOTE — Progress Notes (Signed)
Patient has been having 3-5 beats VT, runs of SVT, and going in/out of A-fib with a rate of 140's.  Patient resting in bed and asymptomatic. Patient was receiving PRBC at this time and blood pressure 106/60. MD on call made aware. Labs ordered and RN will continue to monitor patient for changes in condition. Bradley FerrisBrandie Jenniah Bhavsar RN BSN 06/20/2014

## 2014-06-20 NOTE — Progress Notes (Signed)
Occupational Therapy Evaluation Patient Details Name: Jeffrey Jeffrey [name] Youngberg MRN: [id number] DOB: 04/21/1918 Today's Date: 06/20/2014    History of Present Illness Jeffrey Jeffrey [name] Schirmer is Jeffrey [name] 80 y.o. male who presents to the ED with c/o generalized weakness. On arrival to ED, he was found to have severe anemia with Jeffrey [name] hemoglobin around 6, Jeffrey [name] UTI , paroxysmal afib  And elevated troponin   Clinical Impression   Friend present during evaluation who states she was his caregiver. Friend states that pt has been "sleeping all day".  Pt very lethargic and unable to fully arouse pt to participate. If family desires to take pt home, they will need to be able to provide 24/7 care. Pt has Jeffrey [name] hospital bed and other DME needed. If family wants to be able to get pt OOB they may want to rent Jeffrey [name] hoyer lift to assist with mobility. If this level of assistance cannot be provided, pt will need SNF.   Follow Up Recommendations    24/7 assistance   Equipment Recommendations    ? Hoyer lift if D/C home   Recommendations for Other Services       Precautions / Restrictions Precautions Precautions: Fall Restrictions Weight Bearing Restrictions: No      Mobility Bed Mobility              General bed mobility comments: unable to fully assess due to lethargy Total @ +2 to slide up in bed. Pt not participating. Unable to hold B knees flexed during mobility.  Transfers                 General transfer comment: Unable to tolerate transfer at this time due to weakness    Balance                                            ADL Overall ADL's :  (total assist)                                             Vision                     Perception     Praxis      Pertinent Vitals/Pain Pain Assessment: Faces Faces Pain Scale: No hurt     Hand Dominance Right   Extremity/Trunk Assessment Upper Extremity Assessment Upper Extremity Assessment: Difficult to  assess due to impaired cognition   Lower Extremity Assessment Lower Extremity Assessment: Defer to PT evaluation       Communication Communication Communication: Other (comment) (unable to determine due to lethargy)   Cognition Arousal/Alertness: Lethargic Behavior During Therapy: Flat affect Overall Cognitive Status: Difficult to assess                     General Comments       Exercises       Shoulder Instructions      Home Living Family/patient expects to be discharged to:: Unsure Living Arrangements: Alone Available Help at Discharge: Friend(s);Available 24 hours/day;Home health;Other (Comment) (Hospice care) Type of Home: Apartment Home Access: Level entry     Home Layout: One level               Home Equipment: Walker - 2  wheels;Cane - single point;Shower seat;Wheelchair - manual;Hospital bed          Prior Functioning/Environment Level of Independence: Needs assistance  Gait / Transfers Assistance Needed: uses w/c for community distances. Reports no device use in house ADL's / Homemaking Assistance Needed: Aide helps with bath during week   Comments: unsure of PLOF. Aids states he was walking until he "got sick". unsure when "got sick" was. States he mostly used w/c for mobility with aid pushing him    OT Diagnosis: Generalized weakness;Cognitive deficits   OT Problem List: Decreased strength;Decreased range of motion;Decreased activity tolerance;Impaired balance (sitting and/or standing);Decreased cognition   OT Treatment/Interventions:      OT Goals(Current goals can be found in the care plan section) Acute Rehab OT Goals Patient Stated Goal: None stated OT Goal Formulation:  (eval only)  OT Frequency:     Barriers to D/C:            Co-evaluation              End of Session Nurse Communication: Mobility status  Activity Tolerance: Patient limited by lethargy Patient left: in bed;with call bell/phone within reach;with  family/visitor present   Time: [id number] OT Time Calculation (min): 14 min Charges:  OT General Charges $OT Visit: 1 Procedure OT Evaluation $Initial OT Evaluation Tier I: 1 Procedure OT Treatments $Therapeutic Activity: 8-22 mins G-Codes:    Jovan Colligan,HILLARY 06/20/2014, 3:29 PM   [facility] Teneshia Hedeen, OTR/L  [phone] 06/20/2014

## 2014-06-20 NOTE — Progress Notes (Signed)
UR complete.  Emmie Frakes RN, MSN 

## 2014-06-21 LAB — TYPE AND SCREEN
ABO/RH(D): O POS
ANTIBODY SCREEN: POSITIVE
DAT, IgG: NEGATIVE
Donor AG Type: NEGATIVE
Unit division: 0

## 2014-06-21 LAB — CBC
HEMATOCRIT: 27.2 % — AB (ref 39.0–52.0)
HEMOGLOBIN: 8.4 g/dL — AB (ref 13.0–17.0)
MCH: 23.1 pg — ABNORMAL LOW (ref 26.0–34.0)
MCHC: 30.9 g/dL (ref 30.0–36.0)
MCV: 74.9 fL — ABNORMAL LOW (ref 78.0–100.0)
Platelets: 170 10*3/uL (ref 150–400)
RBC: 3.63 MIL/uL — ABNORMAL LOW (ref 4.22–5.81)
RDW: 20 % — ABNORMAL HIGH (ref 11.5–15.5)
WBC: 5.5 10*3/uL (ref 4.0–10.5)

## 2014-06-21 LAB — GLUCOSE, CAPILLARY: Glucose-Capillary: 129 mg/dL — ABNORMAL HIGH (ref 70–99)

## 2014-06-21 MED ORDER — FERROUS SULFATE 325 (65 FE) MG PO TABS: 325.0000 mg | ORAL_TABLET | Freq: Three times a day (TID) | ORAL | Status: AC

## 2014-06-21 MED ORDER — METOPROLOL TARTRATE 25 MG PO TABS: 25.0000 mg | ORAL_TABLET | Freq: Two times a day (BID) | ORAL | Status: AC

## 2014-06-21 MED ORDER — CLOPIDOGREL BISULFATE 75 MG PO TABS: 75.0000 mg | ORAL_TABLET | Freq: Every day | ORAL | Status: AC

## 2014-06-21 MED ORDER — FUROSEMIDE 20 MG PO TABS: 20.0000 mg | ORAL_TABLET | Freq: Every day | ORAL | Status: AC

## 2014-06-21 NOTE — Progress Notes (Signed)
PT Cancellation Note  Patient Details Name: Jeffrey [name] MRN: [id number] DOB: 11/03/1917   Cancelled Treatment:    Reason Eval/Treat Not Completed: Other (comment) (sleeping) RN reports patient is sleeping soundly and awaiting transport to SNF. Will continue to follow until d/c.  [address]shawn [place], [place] [phone]   [name], Jakyria Bleau S 06/21/2014, 5:49 PM

## 2014-06-21 NOTE — Discharge Summary (Signed)
Physician Discharge Summary  EESHAN VERBRUGGE ZOX:[id number] DOB: 1918/04/28 DOA: 06/15/2014  PCP: Leo Grosser, MD  Admit date: 06/15/2014 Discharge date: 06/21/2014  Time spent: 35 minutes  Recommendations for Outpatient Follow-up:  1. Palliative care to follow at SNF 2. DNR  Discharge Diagnoses:  Principal Problem:   Symptomatic anemia Active Problems:   Anemia   UTI (lower urinary tract infection)   Elevated troponin   Discharge Condition: improved  Diet recommendation: heart healthy  Filed Weights   06/16/14 0400 06/18/14 0430  Weight: 60.9 kg (134 lb 4.2 oz) 61.871 kg (136 lb 6.4 oz)    History of present illness:  Jeffrey [name] is a 80 y.o. male who presents to the ED with c/o generalized weakness. Symptoms got significantly worse today though he has been weak for some time now. Patient reports associated dizziness. Denies pain and denies chest pain specifically. No melena, BRBPR, or bleeding from anywhere.   Hospital Course:  Elevated troponin without ACS:  Medical management.  Asa/plavix per cards  Paroxysmal atrial fibrillation:  - Increased the dose of metoprolol   Urinary tract infection:  Multi-growth; d/c abx   Anemia:  Stool for occult blood negative.  Anemia panel show low iron levels and we have added on iron supplements with stool softeners. Probably anemia of chronic disease vs another underlying etiology  S/p 3 units PRBC - anemia has been issue in the past and workup has not been pursued to to patient's age/condition  -palliative care/hospice  CKD III  monitor   Failure to thrive  Poor overall prognosis   Procedures:    Consultations:  cards  Discharge Exam: Filed Vitals:   06/21/14 1123  BP: 124/72  Pulse: 72  Temp:   Resp:     General: pleasant Cardiovascular: rrr   Discharge Instructions You were cared for by a hospitalist during your hospital stay. If you have any questions about your discharge  medications or the care you received while you were in the hospital after you are discharged, you can call the unit and asked to speak with the hospitalist on call if the hospitalist that took care of you is not available. Once you are discharged, your primary care physician will handle any further medical issues. Please note that NO REFILLS for any discharge medications will be authorized once you are discharged, as it is imperative that you return to your primary care physician (or establish a relationship with a primary care physician if you do not have one) for your aftercare needs so that they can reassess your need for medications and monitor your lab values.  Discharge Instructions   Discharge instructions    Complete by:  As directed   Cbc 1 week Palliative care to follow at SNF     Increase activity slowly    Complete by:  As directed           Current Discharge Medication List    START taking these medications   Details  clopidogrel (PLAVIX) 75 MG tablet Take 1 tablet (75 mg total) by mouth daily.    ferrous sulfate 325 (65 FE) MG tablet Take 1 tablet (325 mg total) by mouth 3 (three) times daily with meals. Refills: 3    furosemide (LASIX) 20 MG tablet Take 1 tablet (20 mg total) by mouth daily. Qty: 30 tablet    metoprolol tartrate (LOPRESSOR) 25 MG tablet Take 1 tablet (25 mg total) by mouth 2 (two) times daily.  STOP taking these medications     naproxen sodium (ANAPROX) 220 MG tablet      nitroGLYCERIN (NITROSTAT) 0.4 MG SL tablet      pantoprazole (PROTONIX) 40 MG tablet        No Known Allergies    The results of significant diagnostics from this hospitalization (including imaging, microbiology, ancillary and laboratory) are listed below for reference.    Significant Diagnostic Studies: Dg Chest Port 1 View  06/17/2014   CLINICAL DATA:  CHF  EXAM: PORTABLE CHEST - 1 VIEW  COMPARISON:  06/15/2014  FINDINGS: Cardiac shadow remains mildly enlarged.  Persistent left retrocardiac density is noted with slight worsening in the interval from the prior exam. Bilateral pleural effusions are noted. No acute bony abnormality is seen.  IMPRESSION: Left basilar opacity with small bilateral pleural effusions.   Electronically Signed   By: Alcide CleverMark  Lukens M.D.   On: 06/17/2014 12:00   Dg Chest Port 1 View  06/15/2014   CLINICAL DATA:  Weakness and cough for 1 day initial evaluation  EXAM: PORTABLE CHEST - 1 VIEW  COMPARISON:  10/22/2013  FINDINGS: Mild cardiac enlargement. Vascular pattern normal. Mild right base atelectasis. Retrocardiac opacity.  IMPRESSION: Retrocardiac opacity could represent consolidation and/or effusion.   Electronically Signed   By: Esperanza Heiraymond  Rubner M.D.   On: 06/15/2014 23:22    Microbiology: Recent Results (from the past 240 hour(s))  URINE CULTURE     Status: None   Collection Time    06/15/14 11:33 PM      Result Value Ref Range Status   Specimen Description URINE, CATHETERIZED   Final   Special Requests NONE   Final   Culture  Setup Time     Final   Value: 06/17/2014 00:16     Performed at [facility]   Colony Count     Final   Value: >=100,000 COLONIES/ML     Performed at [facility]   Culture     Final   Value: Multiple bacterial morphotypes present, none predominant. Suggest appropriate recollection if clinically indicated.     Performed at [facility]   Report Status 06/18/2014 FINAL   Final  CULTURE, BLOOD (ROUTINE X 2)     Status: None   Collection Time    06/16/14 12:01 AM      Result Value Ref Range Status   Specimen Description BLOOD RIGHT ARM   Final   Special Requests BOTTLES DRAWN AEROBIC AND ANAEROBIC 6CC   Final   Culture NO GROWTH 4 DAYS   Final   Report Status PENDING   Incomplete  CULTURE, BLOOD (ROUTINE X 2)     Status: None   Collection Time    06/16/14 12:19 AM      Result Value Ref Range Status   Specimen Description BLOOD RIGHT HAND   Final   Special Requests  BOTTLES DRAWN AEROBIC AND ANAEROBIC [facility]CC   Final   Culture NO GROWTH 4 DAYS   Final   Report Status PENDING   Incomplete     Labs: Basic Metabolic Panel:  Recent Labs Lab 06/15/14 2302 06/17/14 0355 06/18/14 0412 06/20/14 0441 06/20/14 1951  NA 142 140 139 141  --   K 4.0 4.4 4.3 4.3  --   CL 106 106 103 109  --   CO2 24 20 21 20   --   GLUCOSE 129* 82 122* 103*  --   BUN 32* 44* 60* 42*  --  CREATININE 1.17 1.38* 1.75* 1.24  --   CALCIUM 8.9 8.4 8.4 8.3*  --   MG  --   --   --   --  2.4   Liver Function Tests:  Recent Labs Lab 06/15/14 2302  AST 27  ALT 12  ALKPHOS 72  BILITOT 0.3  PROT 7.2  ALBUMIN 3.4*   No results found for this basename: LIPASE, AMYLASE,  in the last 168 hours No results found for this basename: AMMONIA,  in the last 168 hours CBC:  Recent Labs Lab 06/15/14 2302 06/16/14 1100 06/17/14 0355 06/18/14 0412 06/20/14 0441 06/21/14 0814  WBC 8.4 8.2 6.4 5.3 4.9 5.5  NEUTROABS 6.6  --   --   --   --   --   HGB 6.2* 6.9* 7.6* 7.8* 7.4* 8.4*  HCT 20.5* 22.4* 24.8* 25.1* 24.4* 27.2*  MCV 71.9* 70.7* 71.7* 73.8* 74.8* 74.9*  PLT 193 185 141* 171 177 170   Cardiac Enzymes:  Recent Labs Lab 06/15/14 2302 06/16/14 0034  TROPONINI 0.78* 0.81*   BNP: BNP (last 3 results)  Recent Labs  10/21/13 1144 06/15/14 2302  PROBNP 4163.0* 12148.0*   CBG:  Recent Labs Lab 06/17/14 1432 06/20/14 2104 06/21/14 0645  GLUCAP 113* 121* 129*       Signed:  Benjamine MolaVANN, Taressa Rauh  [facility] 06/21/2014, 12:37 PM

## 2014-06-21 NOTE — Clinical Social Work Placement (Signed)
Clinical Social Work Department CLINICAL SOCIAL WORK PLACEMENT NOTE 06/21/2014  Patient:  Jeffrey [name],Jeffrey [name]  Account Number:  [id number] Admit date:  06/15/2014  Clinical Social Worker:  Deidrick Rainey, LCSWA  Date/time:  06/20/2014 05:26 PM  Clinical Social Work is seeking post-discharge placement for this patient at the following level of care:   SKILLED NURSING   (*CSW will update this form in Epic as items are completed)   06/20/2014  Patient/family provided with [facility] Department of Clinical Social Work's list of facilities offering this level of care within the geographic area requested by the patient (or if unable, by the patient's family).  06/20/2014  Patient/family informed of their freedom to choose among providers that offer the needed level of care, that participate in Medicare, Medicaid or managed care program needed by the patient, have an available bed and are willing to accept the patient.  06/20/2014  Patient/family informed of [facility]' ownership interest in [facility], as well as of the fact that they are under no obligation to receive care at this facility.  PASARR submitted to EDS on 06/20/2014 PASARR number received on 06/20/2014  FL2 transmitted to all facilities in geographic area requested by pt/family on  06/20/2014 FL2 transmitted to all facilities within larger geographic area on 06/20/2014  Patient informed that his/her managed care company has contracts with or will negotiate with  certain facilities, including the following:     Patient/family informed of bed offers received:  06/21/2014 [facility] Physician recommends and patient chooses bed at    [facility]  06/21/2014 Patient to be transferred to facility by [facility] Patient and family notified of transfer on 06/21/2014 Name of family member notified:  Jamesetta Orleansracey Blackwell  The following physician  request were entered in Epic:   Additional Comments:  Ervin KnackEric R. Emoree Sasaki, MSW, Theresia MajorsLCSWA [phone] 06/21/2014 5:31 PM

## 2014-06-21 NOTE — Clinical Social Work Note (Signed)
[facility] [place].  Patient and family agreeable to plans will transport via ems RN to call report. Windell MouldingEric Glendale Youngblood, MSW, Theresia MajorsLCSWA [phone]

## 2014-06-21 NOTE — Clinical Social Work Psychosocial (Addendum)
Clinical Social Work Department BRIEF PSYCHOSOCIAL ASSESSMENT 06/20/2014  Patient:  Jeffrey [name],Jeffrey [name]     Account Number:  [id number]     Admit date:  06/15/2014  Clinical Social Worker:  Elouise MunroeANTERHAUS,Reeanna Acri, LCSWA  Date/Time:  06/19/2014 05:12 PM  Referred by:  Physician  Date Referred:  06/20/2014 Referred for  SNF Placement   Other Referral:   Interview type:  Patient Other interview type:   family    PSYCHOSOCIAL DATA Living Status:  SIGNIFICANT OTHER Admitted from facility:   Level of care:   Primary support name:  Jeffrey [name] Primary support relationship to patient:  FAMILY Degree of support available:   Well supported has [name] significant other named Jeffrey [name] who visits him frequently and tries to help take care of patient.    CURRENT CONCERNS  Other Concerns:    SOCIAL WORK ASSESSMENT / PLAN Patient is 80 year old male who lives in an apartment. Patient has [name] significant other who helps take care of him but does not live with him.  Patient has [name] daughter who is quite involved in his care as well.  Patient plans to go to rehab and possibly palliative care.  Patient had hospice in home.   Assessment/plan status:   Other assessment/ plan:   Information/referral to community resources:    PATIENT'S/FAMILY'S RESPONSE TO PLAN OF CARE: Patient and family are in agreement with plan to have patient go to rehab facility.    Ervin KnackEric R. [name], MSW, Theresia MajorsLCSWA [phone] 06/21/2014 5:18 PM

## 2014-06-22 LAB — CULTURE, BLOOD (ROUTINE X 2)
CULTURE: NO GROWTH
Culture: NO GROWTH

## 2014-08-08 ENCOUNTER — Encounter (HOSPITAL_COMMUNITY): Payer: Self-pay | Admitting: Cardiovascular Disease

## 2014-08-30 DEATH — deceased

## 2015-11-10 IMAGING — CR DG CHEST 2V
2 series · 2 of 2 positions shown · non-contrast
Comparison: DG CHEST 1V PORT dated 10/21/2013

CLINICAL DATA: CHF follow up.

EXAM:
CHEST  2 VIEW

[view not recorded (1 of 2)]
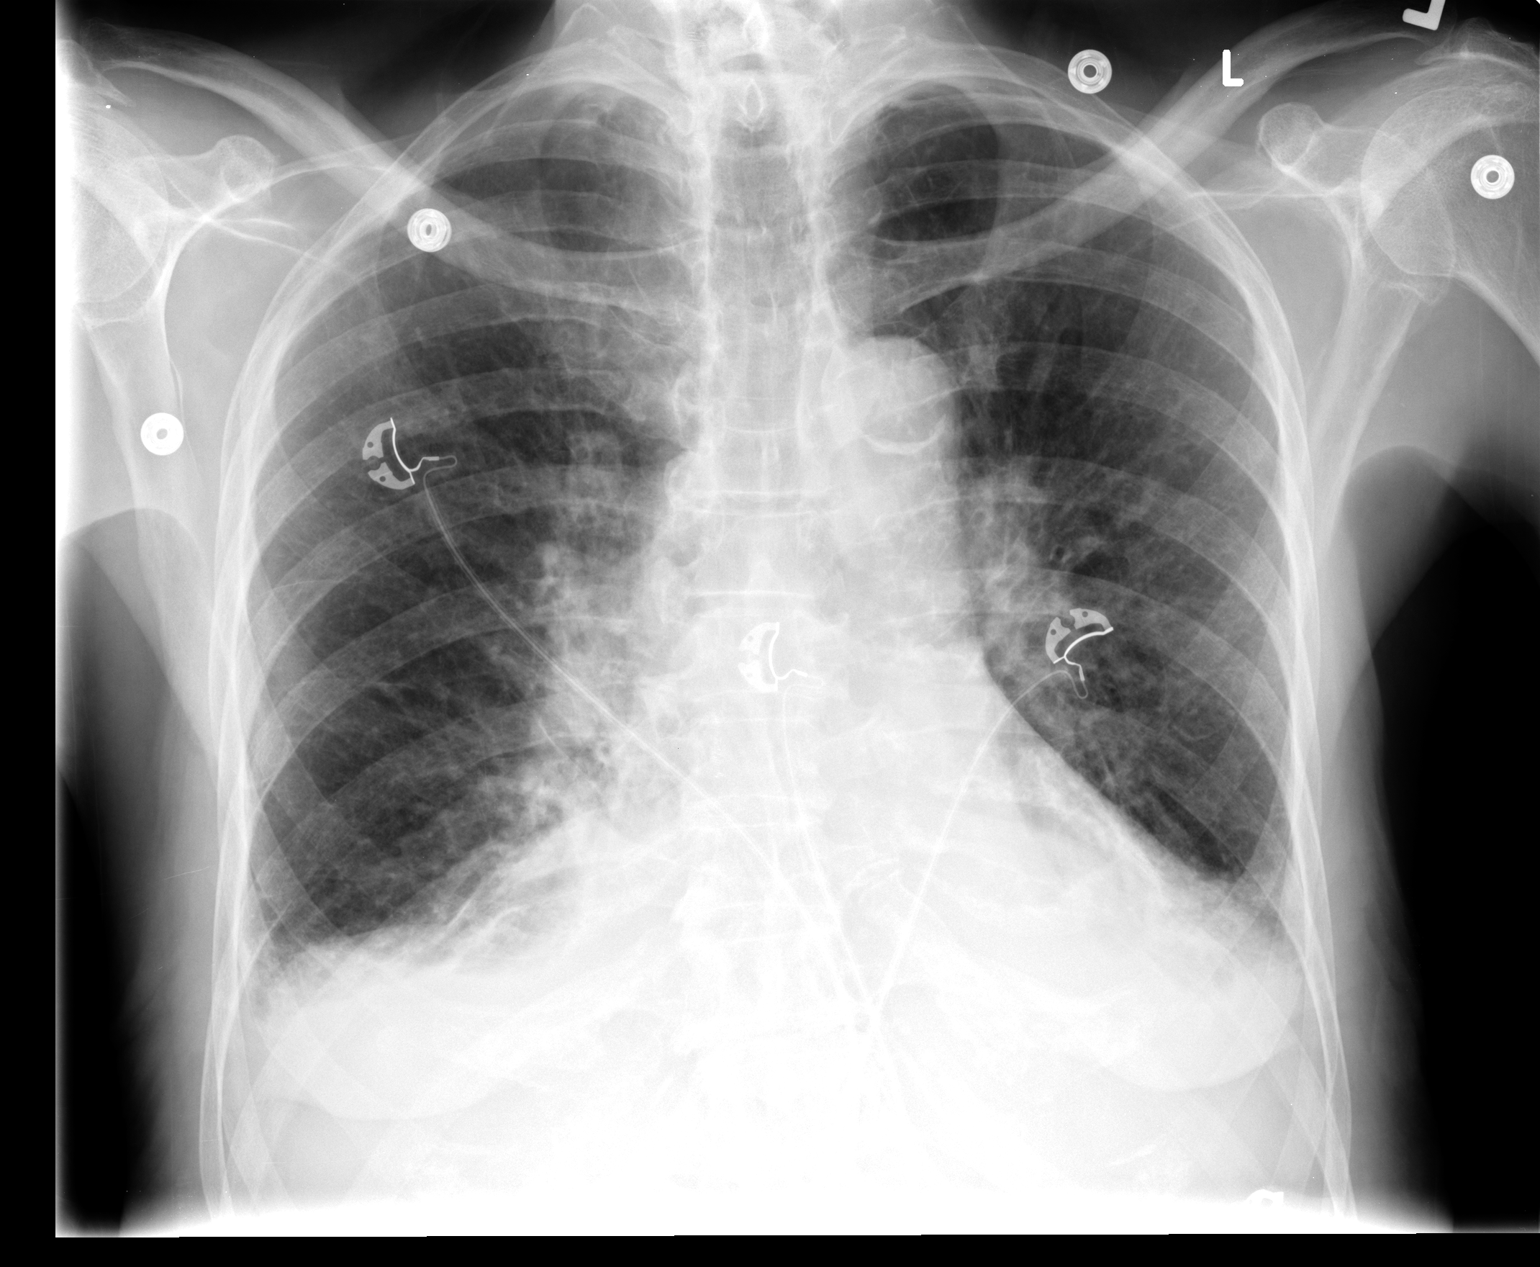

[view not recorded (2 of 2)]
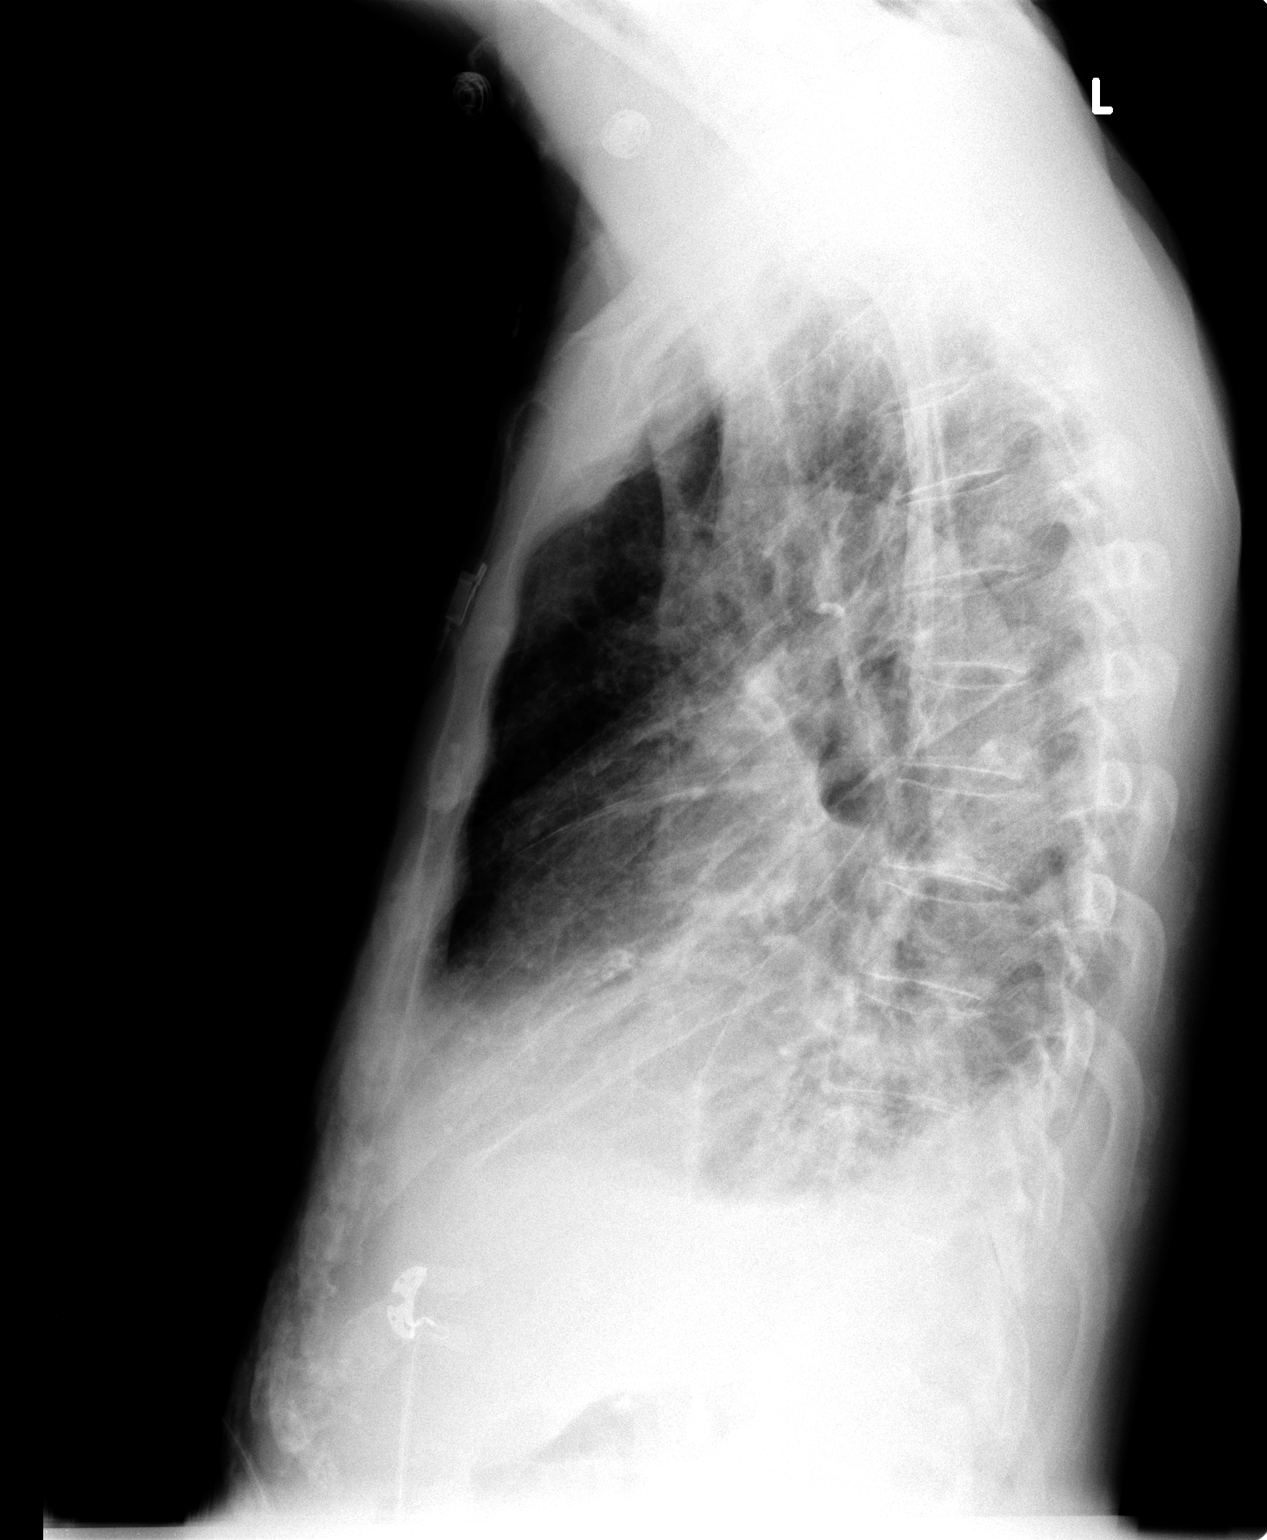

[2 of 2 positions shown; findings below may reference images not displayed]

FINDINGS: There is prominence of the interstitial markings and mild
peribronchial cuffing. When compared to previous study these
findings have decreased in conspicuity. Cardiac silhouette is
enlarged. Atherosclerotic calcifications identified within the
aorta. Blunting of the costophrenic angles appreciated as well as
mild increased density within the lung bases. No focal regions
consolidation. The osseous structures are unremarkable.
IMPRESSION: Improved pulmonary edema, with mild residua. No new focal regions of
consolidation or new focal infiltrates.

## 2016-07-05 IMAGING — CR DG CHEST 1V PORT
1 series · 1 of 1 positions shown · non-contrast
Comparison: 06/15/2014

CLINICAL DATA: CHF

EXAM:
PORTABLE CHEST - 1 VIEW

[AP]
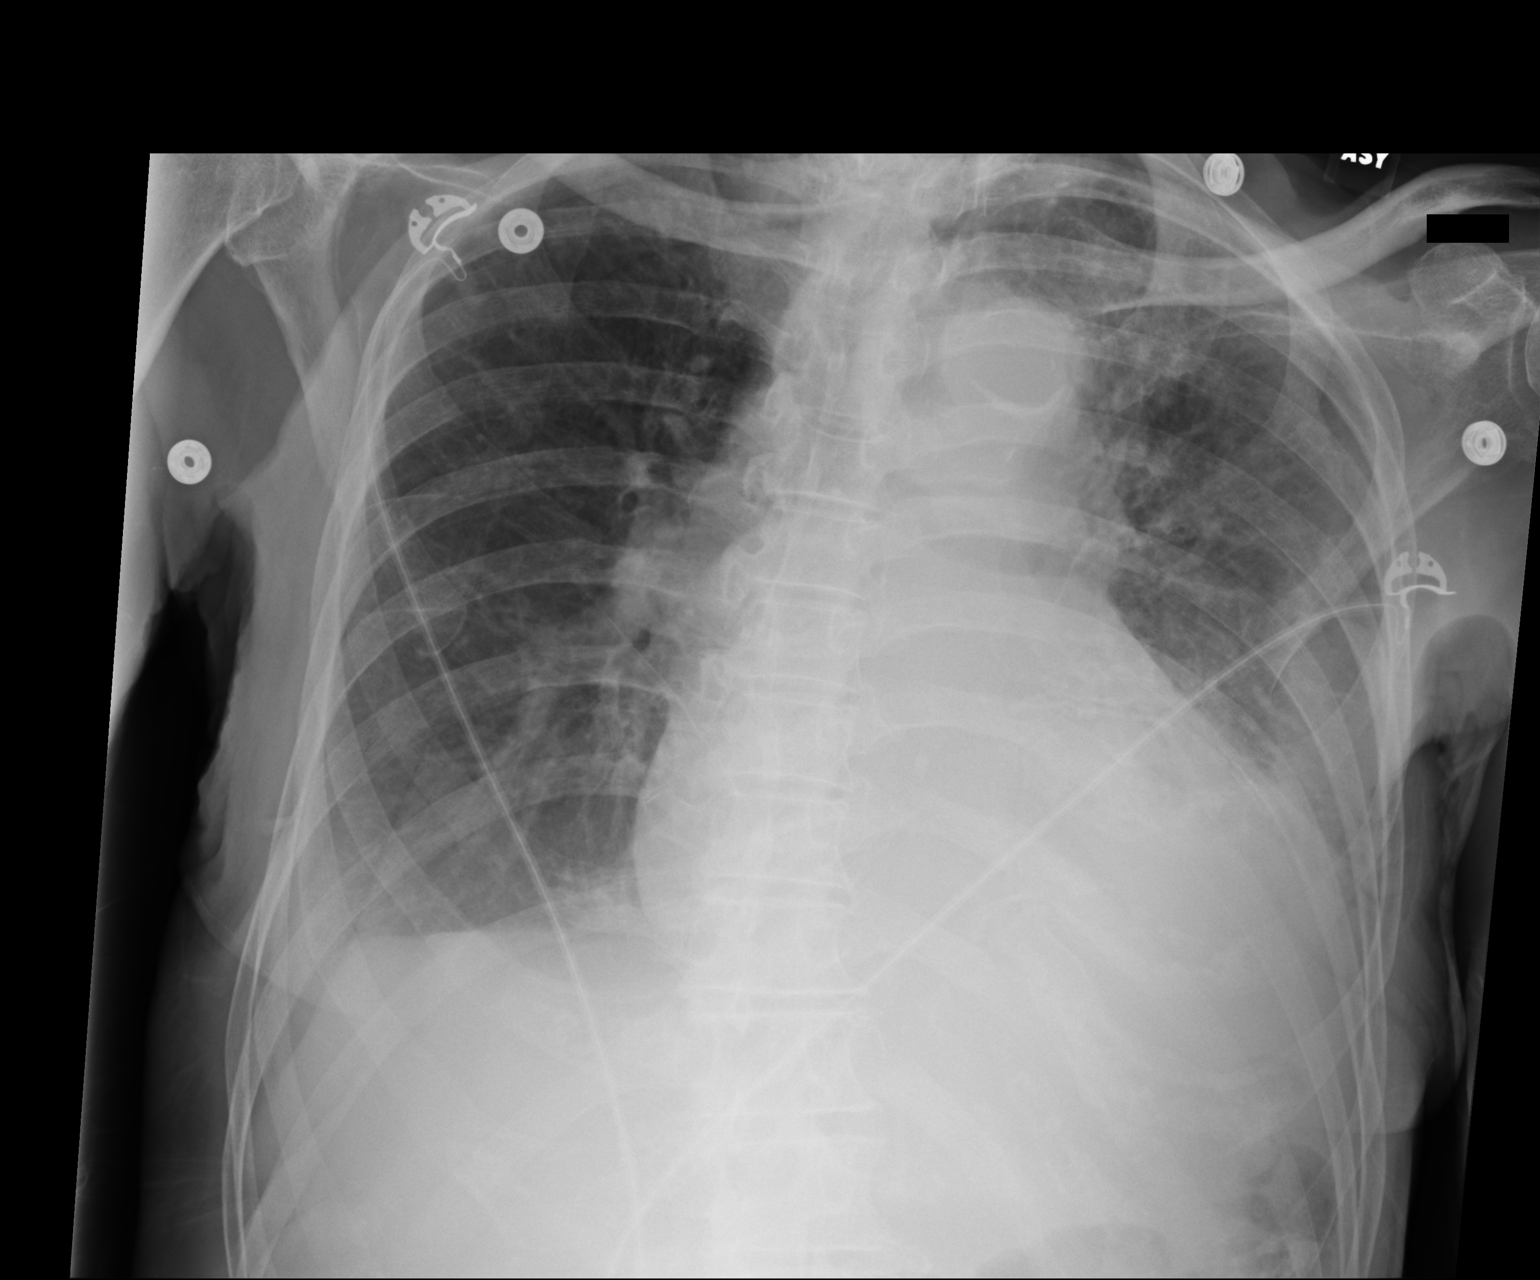

[1 of 1 positions shown; findings below may reference images not displayed]

FINDINGS: Cardiac shadow remains mildly enlarged. Persistent left retrocardiac
density is noted with slight worsening in the interval from the
prior exam. Bilateral pleural effusions are noted. No acute bony
abnormality is seen.
IMPRESSION: Left basilar opacity with small bilateral pleural effusions.
